# Patient Record
Sex: Female | Born: 1960 | Race: Black or African American | Hispanic: No | Marital: Married | Smoking: Former smoker
Health system: Southern US, Community
[De-identification: ages and names within clinical notes are randomized; demographics above are authoritative.]

## PROBLEM LIST (undated history)

## (undated) DIAGNOSIS — I10 Essential (primary) hypertension: Secondary | ICD-10-CM

## (undated) DIAGNOSIS — I509 Heart failure, unspecified: Secondary | ICD-10-CM

## (undated) DIAGNOSIS — E785 Hyperlipidemia, unspecified: Secondary | ICD-10-CM

## (undated) DIAGNOSIS — E119 Type 2 diabetes mellitus without complications: Secondary | ICD-10-CM

## (undated) DIAGNOSIS — I2510 Atherosclerotic heart disease of native coronary artery without angina pectoris: Secondary | ICD-10-CM

## (undated) DIAGNOSIS — I517 Cardiomegaly: Secondary | ICD-10-CM

## (undated) DIAGNOSIS — I639 Cerebral infarction, unspecified: Secondary | ICD-10-CM

## (undated) DIAGNOSIS — E669 Obesity, unspecified: Secondary | ICD-10-CM

## (undated) DIAGNOSIS — R0600 Dyspnea, unspecified: Secondary | ICD-10-CM

## (undated) DIAGNOSIS — Z8673 Personal history of transient ischemic attack (TIA), and cerebral infarction without residual deficits: Secondary | ICD-10-CM

## (undated) DIAGNOSIS — K219 Gastro-esophageal reflux disease without esophagitis: Secondary | ICD-10-CM

## (undated) DIAGNOSIS — G4733 Obstructive sleep apnea (adult) (pediatric): Secondary | ICD-10-CM

## (undated) HISTORY — DX: Hyperlipidemia, unspecified: E78.5

## (undated) HISTORY — DX: Obstructive sleep apnea (adult) (pediatric): G47.33

## (undated) HISTORY — DX: Obesity, unspecified: E66.9

## (undated) HISTORY — DX: Personal history of transient ischemic attack (TIA), and cerebral infarction without residual deficits: Z86.73

## (undated) HISTORY — DX: Dyspnea, unspecified: R06.00

## (undated) HISTORY — PX: CERVICAL ABLATION: SHX5771

## (undated) HISTORY — DX: Cerebral infarction, unspecified: I63.9

## (undated) HISTORY — DX: Type 2 diabetes mellitus without complications: E11.9

## (undated) HISTORY — DX: Essential (primary) hypertension: I10

## (undated) HISTORY — DX: Gastro-esophageal reflux disease without esophagitis: K21.9

---

## 2000-03-12 DIAGNOSIS — I639 Cerebral infarction, unspecified: Secondary | ICD-10-CM

## 2000-03-12 HISTORY — DX: Cerebral infarction, unspecified: I63.9

## 2004-03-31 ENCOUNTER — Emergency Department: Payer: Self-pay | Admitting: Emergency Medicine

## 2004-04-26 ENCOUNTER — Ambulatory Visit: Payer: Self-pay

## 2005-10-30 ENCOUNTER — Ambulatory Visit: Payer: Self-pay | Admitting: Family Medicine

## 2007-08-04 ENCOUNTER — Emergency Department: Payer: Self-pay | Admitting: Emergency Medicine

## 2007-11-19 ENCOUNTER — Emergency Department: Payer: Self-pay | Admitting: Emergency Medicine

## 2008-01-20 ENCOUNTER — Ambulatory Visit: Payer: Self-pay

## 2008-03-12 HISTORY — PX: CARDIAC CATHETERIZATION: SHX172

## 2008-04-24 ENCOUNTER — Emergency Department: Payer: Self-pay | Admitting: Emergency Medicine

## 2008-08-08 ENCOUNTER — Ambulatory Visit: Payer: Self-pay | Admitting: Cardiology

## 2008-08-08 ENCOUNTER — Inpatient Hospital Stay: Payer: Self-pay | Admitting: *Deleted

## 2008-08-09 ENCOUNTER — Encounter: Payer: Self-pay | Admitting: Cardiology

## 2008-09-07 ENCOUNTER — Emergency Department: Payer: Self-pay | Admitting: Emergency Medicine

## 2008-09-11 DIAGNOSIS — E785 Hyperlipidemia, unspecified: Secondary | ICD-10-CM | POA: Insufficient documentation

## 2008-09-11 DIAGNOSIS — I10 Essential (primary) hypertension: Secondary | ICD-10-CM

## 2008-09-11 DIAGNOSIS — R079 Chest pain, unspecified: Secondary | ICD-10-CM

## 2008-09-15 ENCOUNTER — Ambulatory Visit: Payer: Self-pay | Admitting: Cardiology

## 2008-09-15 ENCOUNTER — Encounter: Payer: Self-pay | Admitting: Cardiology

## 2008-09-15 DIAGNOSIS — R0602 Shortness of breath: Secondary | ICD-10-CM

## 2008-09-17 LAB — CONVERTED CEMR LAB
Pro B Natriuretic peptide (BNP): <2 pg/mL (ref 0.0–100.0)
TSH: 2.136 microintl units/mL (ref 0.350–4.500)

## 2008-09-21 ENCOUNTER — Emergency Department: Payer: Self-pay | Admitting: Unknown Physician Specialty

## 2008-09-22 ENCOUNTER — Ambulatory Visit: Payer: Self-pay | Admitting: Cardiology

## 2008-09-22 ENCOUNTER — Encounter: Payer: Self-pay | Admitting: Cardiology

## 2008-09-29 ENCOUNTER — Ambulatory Visit: Payer: Self-pay

## 2008-09-29 ENCOUNTER — Encounter: Payer: Self-pay | Admitting: Cardiology

## 2008-09-30 LAB — CONVERTED CEMR LAB
Cholesterol: 109 mg/dL
Triglyceride fasting, serum: 40 mg/dL

## 2008-10-07 ENCOUNTER — Ambulatory Visit: Payer: Self-pay | Admitting: Cardiology

## 2008-10-11 ENCOUNTER — Encounter: Payer: Self-pay | Admitting: Cardiology

## 2008-10-12 ENCOUNTER — Encounter: Payer: Self-pay | Admitting: Cardiology

## 2008-10-12 ENCOUNTER — Ambulatory Visit: Payer: Self-pay | Admitting: Cardiology

## 2008-10-18 ENCOUNTER — Encounter: Payer: Self-pay | Admitting: Cardiology

## 2008-12-15 ENCOUNTER — Emergency Department: Payer: Self-pay | Admitting: Emergency Medicine

## 2009-07-10 ENCOUNTER — Emergency Department: Payer: Self-pay | Admitting: Internal Medicine

## 2009-11-18 ENCOUNTER — Emergency Department: Payer: Self-pay | Admitting: Emergency Medicine

## 2009-12-22 ENCOUNTER — Ambulatory Visit: Payer: Self-pay

## 2010-08-25 ENCOUNTER — Encounter: Payer: Self-pay | Admitting: Cardiovascular Disease

## 2011-07-10 LAB — COMPREHENSIVE METABOLIC PANEL
Albumin: 3.4 g/dL (ref 3.4–5.0)
Alkaline Phosphatase: 67 U/L (ref 50–136)
Anion Gap: 6 — ABNORMAL LOW (ref 7–16)
BUN: 15 mg/dL (ref 7–18)
Calcium, Total: 8.9 mg/dL (ref 8.5–10.1)
Chloride: 103 mmol/L (ref 98–107)
Creatinine: 1.06 mg/dL (ref 0.60–1.30)
EGFR (Non-African Amer.): >60
Glucose: 96 mg/dL (ref 65–99)
SGOT(AST): 12 U/L — ABNORMAL LOW (ref 15–37)
Sodium: 137 mmol/L (ref 136–145)

## 2011-07-10 LAB — CBC
HCT: 40.2 % (ref 35.0–47.0)
HGB: 13.2 g/dL (ref 12.0–16.0)
MCH: 30.6 pg (ref 26.0–34.0)
MCV: 93 fL (ref 80–100)
RBC: 4.32 10*6/uL (ref 3.80–5.20)
RDW: 13.3 % (ref 11.5–14.5)
WBC: 10.3 10*3/uL (ref 3.6–11.0)

## 2011-07-10 LAB — CK TOTAL AND CKMB (NOT AT ARMC)
CK, Total: 101 U/L (ref 21–215)
CK-MB: 1.6 ng/mL (ref 0.5–3.6)

## 2011-07-10 LAB — APTT: Activated PTT: 30.4 secs (ref 23.6–35.9)

## 2011-07-10 LAB — PROTIME-INR: Prothrombin Time: 13.3 secs (ref 11.5–14.7)

## 2011-07-11 ENCOUNTER — Observation Stay: Payer: Self-pay | Admitting: *Deleted

## 2011-07-11 DIAGNOSIS — R079 Chest pain, unspecified: Secondary | ICD-10-CM

## 2011-07-11 LAB — CK TOTAL AND CKMB (NOT AT ARMC)
CK, Total: 157 U/L (ref 21–215)
CK-MB: 1.3 ng/mL (ref 0.5–3.6)

## 2011-07-11 LAB — CK-MB: CK-MB: 1.3 ng/mL (ref 0.5–3.6)

## 2011-07-11 LAB — TROPONIN I: Troponin-I: <0.02 ng/mL

## 2011-07-12 LAB — LIPID PANEL
HDL Cholesterol: 43 mg/dL (ref 40–60)
Ldl Cholesterol, Calc: 130 mg/dL — ABNORMAL HIGH (ref 0–100)

## 2011-07-12 LAB — BASIC METABOLIC PANEL
Anion Gap: 10 (ref 7–16)
Chloride: 104 mmol/L (ref 98–107)
Creatinine: 0.94 mg/dL (ref 0.60–1.30)
EGFR (Non-African Amer.): >60
Glucose: 105 mg/dL — ABNORMAL HIGH (ref 65–99)
Osmolality: 278 (ref 275–301)
Sodium: 137 mmol/L (ref 136–145)

## 2011-07-13 ENCOUNTER — Emergency Department: Payer: Self-pay | Admitting: *Deleted

## 2011-07-13 LAB — BASIC METABOLIC PANEL
Anion Gap: 6 — ABNORMAL LOW (ref 7–16)
BUN: 20 mg/dL — ABNORMAL HIGH (ref 7–18)
Chloride: 105 mmol/L (ref 98–107)
Glucose: 108 mg/dL — ABNORMAL HIGH (ref 65–99)
Osmolality: 275 (ref 275–301)
Potassium: 5 mmol/L (ref 3.5–5.1)
Sodium: 136 mmol/L (ref 136–145)

## 2011-07-13 LAB — CBC
HGB: 13.3 g/dL (ref 12.0–16.0)
MCH: 30.3 pg (ref 26.0–34.0)
MCHC: 31.9 g/dL — ABNORMAL LOW (ref 32.0–36.0)
MCV: 95 fL (ref 80–100)
Platelet: 269 10*3/uL (ref 150–440)
RBC: 4.39 10*6/uL (ref 3.80–5.20)
RDW: 14 % (ref 11.5–14.5)
WBC: 15.1 10*3/uL — ABNORMAL HIGH (ref 3.6–11.0)

## 2011-07-13 LAB — TROPONIN I: Troponin-I: <0.02 ng/mL

## 2011-07-25 ENCOUNTER — Encounter: Payer: Self-pay | Admitting: Cardiovascular Disease

## 2011-07-26 ENCOUNTER — Emergency Department: Payer: Self-pay | Admitting: Unknown Physician Specialty

## 2011-07-26 LAB — CBC
HCT: 39.8 % (ref 35.0–47.0)
HGB: 13.4 g/dL (ref 12.0–16.0)
MCH: 31.5 pg (ref 26.0–34.0)
RBC: 4.27 10*6/uL (ref 3.80–5.20)
RDW: 13.2 % (ref 11.5–14.5)
WBC: 9.3 10*3/uL (ref 3.6–11.0)

## 2011-07-26 LAB — PROTIME-INR
INR: 1
Prothrombin Time: 13.4 s

## 2011-07-26 LAB — BASIC METABOLIC PANEL
Calcium, Total: 9.1 mg/dL (ref 8.5–10.1)
Chloride: 104 mmol/L (ref 98–107)
Co2: 28 mmol/L (ref 21–32)
EGFR (Non-African Amer.): 54 — ABNORMAL LOW
Glucose: 93 mg/dL (ref 65–99)
Osmolality: 275 (ref 275–301)
Potassium: 4 mmol/L (ref 3.5–5.1)

## 2011-07-26 LAB — CK TOTAL AND CKMB (NOT AT ARMC)
CK, Total: 58 U/L (ref 21–215)
CK-MB: 0.6 ng/mL (ref 0.5–3.6)

## 2011-07-26 LAB — TROPONIN I: Troponin-I: <0.02 ng/mL

## 2011-07-26 LAB — APTT: Activated PTT: 33.1 s

## 2011-07-26 LAB — MAGNESIUM: Magnesium: 1.6 mg/dL — ABNORMAL LOW

## 2011-07-27 ENCOUNTER — Encounter: Payer: Self-pay | Admitting: Cardiovascular Disease

## 2011-07-27 ENCOUNTER — Ambulatory Visit (INDEPENDENT_AMBULATORY_CARE_PROVIDER_SITE_OTHER): Payer: Self-pay | Admitting: Cardiovascular Disease

## 2011-07-27 VITALS — BP 112/68 | HR 63 | Ht 68.0 in | Wt 272.0 lb

## 2011-07-27 DIAGNOSIS — R0602 Shortness of breath: Secondary | ICD-10-CM

## 2011-07-27 DIAGNOSIS — I10 Essential (primary) hypertension: Secondary | ICD-10-CM

## 2011-07-27 DIAGNOSIS — E785 Hyperlipidemia, unspecified: Secondary | ICD-10-CM

## 2011-07-27 DIAGNOSIS — R079 Chest pain, unspecified: Secondary | ICD-10-CM

## 2011-07-27 NOTE — Assessment & Plan Note (Signed)
Chest pain is likely noncardiac. Symptoms are very atypical, reproducible with palpation. She is out of her Vicodin pills and we have suggested she call her primary care physician. No further cardiac workup needed at this time given normal stress test, recent cardiac catheterization showing no significant disease.

## 2011-07-27 NOTE — Assessment & Plan Note (Signed)
Her cholesterol medication was recently increased from 10 mg to 20 mg daily given she is not at goal, LDL less than 100.

## 2011-07-27 NOTE — Progress Notes (Signed)
Patient ID: Alicia Fischer, female    DOB: June 12, 1960, 51 y.o.   MRN: [id number]  HPI Comments: [name] is a 51 year old woman with morbid obesity, diabetes, CVA in 2002, hypertension, hyperlipidemia who presented to [facility] May 1 after developing chest pain while mopping. Previous cardiac catheterization in 2000 and showed 20-30% nonobstructive disease, ejection fraction 55%. She had a stress test on her recent admission that showed no ischemia. Remote history of smoking stopped in 2000.  She reports that she continues to have rare episodes of chest pain. She was given Vicodin and prednisone by the hospitalist and reports that the prednisone caused leg swelling. She has run out of Vicodin and is now scheduled to be seen at [facility] until early June. She reports having tried proton pump inhibitors, NSAIDs including ibuprofen for pain with no relief. She has also tried hot compresses with no relief. She does not feel it is her heart given the testing that has looked normal. She is able to reproduce her pain by palpation.  Blood work in the hospital shows total cholesterol 182, LDL 130, HDL 43, creatinine 0.94, hemoglobin A1c 6.2  EKG hospital showed sinus bradycardia with rate 55 beats per minute with no significant ST or T wave changes   Outpatient Encounter Prescriptions as of 07/27/2011  Medication Sig Dispense Refill  . aspirin 81 MG tablet Take 81 mg by mouth daily.        Marland Kitchen atorvastatin (LIPITOR) 20 MG tablet Take 20 mg by mouth daily.       . cephALEXin (KEFLEX) 500 MG capsule Take 500 mg by mouth 3 (three) times daily.      Marland Kitchen glipiZIDE (GLUCOTROL) 5 MG tablet Take 5 mg by mouth daily. Takes 1/2 tablet of 5 mg po daily      . Hydrocodone-Guaifenesin (NARCOF PO) Take 5-325 mg by mouth every 6 (six) hours as needed.      Marland Kitchen lisinopril-hydrochlorothiazide (PRINZIDE,ZESTORETIC) 20-12.5 MG per tablet Take 1 tablet by mouth daily.        Marland Kitchen omeprazole (PRILOSEC) 20 MG capsule Take 20 mg by mouth  daily.          Review of Systems  Constitutional: Negative.   HENT: Negative.   Eyes: Negative.   Respiratory: Negative.   Cardiovascular: Positive for chest pain.  Gastrointestinal: Negative.   Musculoskeletal: Negative.   Skin: Negative.   Neurological: Negative.   Hematological: Negative.   Psychiatric/Behavioral: Negative.   All other systems reviewed and are negative.    BP 112/68  Pulse 63  Ht 5\' 8"  (1.727 m)  Wt 272 lb (123.378 kg)  BMI 41.36 kg/m2  Physical Exam  Nursing note and vitals reviewed. Constitutional: She is oriented to person, place, and time. She appears well-developed and well-nourished.  HENT:  Head: Normocephalic.  Nose: Nose normal.  Mouth/Throat: Oropharynx is clear and moist.  Eyes: Conjunctivae are normal. Pupils are equal, round, and reactive to light.  Neck: Normal range of motion. Neck supple. No JVD present.  Cardiovascular: Normal rate, regular rhythm, S1 normal, S2 normal, normal heart sounds and intact distal pulses.  Exam reveals no gallop and no friction rub.   No murmur heard. Pulmonary/Chest: Effort normal and breath sounds normal. No respiratory distress. She has no wheezes. She has no rales. She exhibits no tenderness.  Abdominal: Soft. Bowel sounds are normal. She exhibits no distension. There is no tenderness.  Musculoskeletal: Normal range of motion. She exhibits no edema and no tenderness.  Lymphadenopathy:  She has no cervical adenopathy.  Neurological: She is alert and oriented to person, place, and time. Coordination normal.  Skin: Skin is warm and dry. No rash noted. No erythema.  Psychiatric: She has a normal mood and affect. Her behavior is normal. Judgment and thought content normal.         Assessment and Plan

## 2011-07-27 NOTE — Assessment & Plan Note (Signed)
Blood pressure is well controlled on today's visit. No changes made to the medications. 

## 2011-07-27 NOTE — Patient Instructions (Signed)
You are doing well. No medication changes were made.  Please call us if you have new issues that need to be addressed before your next appt.  Your physician wants you to follow-up in: 12 months.  You will receive a reminder letter in the mail two months in advance. If you don't receive a letter, please call our office to schedule the follow-up appointment. 

## 2012-01-22 ENCOUNTER — Ambulatory Visit: Payer: Self-pay | Admitting: Nurse Practitioner

## 2012-03-12 HISTORY — PX: BREAST BIOPSY: SHX20

## 2012-07-28 ENCOUNTER — Ambulatory Visit: Payer: Self-pay

## 2012-11-09 ENCOUNTER — Ambulatory Visit: Payer: Self-pay | Admitting: Internal Medicine

## 2013-01-06 ENCOUNTER — Emergency Department: Payer: Self-pay | Admitting: Emergency Medicine

## 2013-01-28 ENCOUNTER — Ambulatory Visit: Payer: Self-pay | Admitting: Podiatry

## 2013-01-28 ENCOUNTER — Ambulatory Visit: Payer: Self-pay | Admitting: Family Medicine

## 2013-02-12 ENCOUNTER — Ambulatory Visit: Payer: Self-pay | Admitting: Internal Medicine

## 2013-02-13 ENCOUNTER — Ambulatory Visit: Payer: Self-pay | Admitting: Nurse Practitioner

## 2013-02-16 ENCOUNTER — Ambulatory Visit: Payer: Self-pay | Admitting: Podiatry

## 2013-02-23 ENCOUNTER — Ambulatory Visit: Payer: Self-pay | Admitting: Nurse Practitioner

## 2013-03-02 ENCOUNTER — Ambulatory Visit: Payer: Self-pay | Admitting: Nurse Practitioner

## 2013-03-03 LAB — PATHOLOGY REPORT

## 2013-05-20 ENCOUNTER — Ambulatory Visit: Payer: Self-pay | Admitting: Physician Assistant

## 2013-05-20 LAB — URIC ACID: Uric Acid: 9.3 mg/dL — ABNORMAL HIGH (ref 2.6–6.0)

## 2013-09-04 ENCOUNTER — Emergency Department: Payer: Self-pay | Admitting: Emergency Medicine

## 2013-09-04 LAB — COMPREHENSIVE METABOLIC PANEL
ALBUMIN: 3.5 g/dL (ref 3.4–5.0)
ALT: 15 U/L (ref 12–78)
ANION GAP: 5 — AB (ref 7–16)
Alkaline Phosphatase: 68 U/L
BILIRUBIN TOTAL: 0.3 mg/dL (ref 0.2–1.0)
BUN: 13 mg/dL (ref 7–18)
CHLORIDE: 105 mmol/L (ref 98–107)
Calcium, Total: 9.6 mg/dL (ref 8.5–10.1)
Co2: 29 mmol/L (ref 21–32)
Creatinine: 1.16 mg/dL (ref 0.60–1.30)
EGFR (African American): >60
EGFR (Non-African Amer.): 53 — ABNORMAL LOW
Glucose: 85 mg/dL (ref 65–99)
Osmolality: 277 (ref 275–301)
Potassium: 3.8 mmol/L (ref 3.5–5.1)
SGOT(AST): 21 U/L (ref 15–37)
SODIUM: 139 mmol/L (ref 136–145)
Total Protein: 8 g/dL (ref 6.4–8.2)

## 2013-09-04 LAB — URINALYSIS, COMPLETE
Bacteria: NONE SEEN
Bilirubin,UR: NEGATIVE
Blood: NEGATIVE
Glucose,UR: NEGATIVE mg/dL (ref 0–75)
Ketone: NEGATIVE
Leukocyte Esterase: NEGATIVE
NITRITE: NEGATIVE
Ph: 6 (ref 4.5–8.0)
Protein: NEGATIVE
RBC,UR: NONE SEEN /HPF (ref 0–5)
SPECIFIC GRAVITY: 1.005 (ref 1.003–1.030)
Squamous Epithelial: 1
WBC UR: NONE SEEN /HPF (ref 0–5)

## 2013-09-04 LAB — CBC
HCT: 38.9 % (ref 35.0–47.0)
HGB: 12.9 g/dL (ref 12.0–16.0)
MCH: 29.4 pg (ref 26.0–34.0)
MCHC: 33.2 g/dL (ref 32.0–36.0)
MCV: 88 fL (ref 80–100)
Platelet: 300 10*3/uL (ref 150–440)
RBC: 4.4 10*6/uL (ref 3.80–5.20)
RDW: 14.7 % — AB (ref 11.5–14.5)
WBC: 8.8 10*3/uL (ref 3.6–11.0)

## 2013-09-04 LAB — PRO B NATRIURETIC PEPTIDE: B-TYPE NATIURETIC PEPTID: 151 pg/mL — AB (ref 0–125)

## 2013-09-04 LAB — TROPONIN I: Troponin-I: <0.02 ng/mL

## 2013-09-14 DIAGNOSIS — R945 Abnormal results of liver function studies: Secondary | ICD-10-CM | POA: Insufficient documentation

## 2013-09-14 DIAGNOSIS — M159 Polyosteoarthritis, unspecified: Secondary | ICD-10-CM | POA: Insufficient documentation

## 2013-09-14 DIAGNOSIS — R609 Edema, unspecified: Secondary | ICD-10-CM | POA: Insufficient documentation

## 2013-09-14 DIAGNOSIS — G2581 Restless legs syndrome: Secondary | ICD-10-CM | POA: Insufficient documentation

## 2013-09-14 DIAGNOSIS — N289 Disorder of kidney and ureter, unspecified: Secondary | ICD-10-CM | POA: Insufficient documentation

## 2013-09-14 DIAGNOSIS — G4733 Obstructive sleep apnea (adult) (pediatric): Secondary | ICD-10-CM | POA: Insufficient documentation

## 2013-09-14 DIAGNOSIS — R252 Cramp and spasm: Secondary | ICD-10-CM | POA: Insufficient documentation

## 2013-09-14 DIAGNOSIS — M25579 Pain in unspecified ankle and joints of unspecified foot: Secondary | ICD-10-CM | POA: Insufficient documentation

## 2013-09-14 DIAGNOSIS — R928 Other abnormal and inconclusive findings on diagnostic imaging of breast: Secondary | ICD-10-CM | POA: Insufficient documentation

## 2013-09-14 DIAGNOSIS — E119 Type 2 diabetes mellitus without complications: Secondary | ICD-10-CM | POA: Insufficient documentation

## 2013-09-14 DIAGNOSIS — M21619 Bunion of unspecified foot: Secondary | ICD-10-CM | POA: Insufficient documentation

## 2013-09-14 DIAGNOSIS — M109 Gout, unspecified: Secondary | ICD-10-CM | POA: Insufficient documentation

## 2014-07-04 NOTE — H&P (Signed)
PATIENT NAME:  Alicia Fischer, Alicia Fischer MR#:  [id number] DATE OF BIRTH:  April 21, 1960  DATE OF ADMISSION:  07/11/2011  PRIMARY CARE PHYSICIAN: Bernestine Amass  PRIMARY CARDIOLOGIST: [facility]   SUBJECTIVE: 54 year old African American female with history of type 2 diabetes, history of cerebrovascular accident with no residual deficit in 2001, hypertension, dyslipidemia who comes in with sudden onset of chest pain. The patient started having substernal chest pain without any radiation while she was sweeping the floor today. The patient's pain lasted for 10 to 15 minutes without radiation. The patient states that the pain is somewhat reproducible but not particularly worse with movement. The patient does have some T wave flattening in the anterior leads. She denied any syncope, near syncope or lightheadedness. She denied any orthopnea, paroxysmal nocturnal dyspnea, dependent edema. The patient had a cardiac catheterization done in 2010 that showed nonobstructive coronary artery disease with an ejection fraction of 55%. She also noted some right ankle swelling but no fever, chills, rigors or cough.   PAST MEDICAL HISTORY: 1. Type 2 diabetes on metformin. 2. Hypertension. 3. Dyslipidemia. 4. History of stroke in 2001.  PAST SURGICAL HISTORY: None.  HOME MEDICATIONS: 1. Metformin. 2. Lisinopril. 3. HCTZ. 4. Lipitor. 5. Aspirin.  ALLERGIES: No known drug allergies.   FAMILY HISTORY: Positive for father dying of heart attack in his 60s.  SOCIAL HISTORY: She quit smoking in 2000. No history of alcohol use. She works as a Lawyer.  REVIEW OF SYSTEMS: CONSTITUTIONAL: She denies any fatigue or weakness. EYES: No blurry vision or double vision. No redness or inflammation. ENT: No tinnitus, ear pain, hearing problems, epistaxis. RESPIRATORY: No cough, wheezing, hemoptysis or dyspnea. CARDIOVASCULAR: No orthopnea. She does have complaints of chest pain. No palpitations, near syncopal episode. GASTROINTESTINAL:  No nausea, vomiting, abdominal pain or hematemesis. GENITOURINARY: No dysuria, hematuria, frequency or urgency. ENDOCRINE: No polyuria, nocturia, heat or cold intolerance. HEME: No anemia or easy bruising or bleeding. INTEGUMENT: No acne or rash. MUSCULOSKELETAL: No arthritis, cramps or swelling. NEUROLOGICAL: No numbness, weakness or dysarthria.   PHYSICAL EXAMINATION: VITAL SIGNS: Blood pressure 124/76, pulse 53, respirations 18.  GENERAL: Comfortable, in no acute cardiopulmonary distress.  HEENT: Pupils equal, reactive. Extraocular movements are intact.  NECK: Supple. No jugular venous distention.  LUNGS: Clear to auscultation bilaterally. No wheezes or crackles or rhonchi.  CARDIOVASCULAR: Regular rate and rhythm. No murmurs, rubs or gallops.  ABDOMEN: Soft, nontender, nondistended.   EXTREMITIES: Trace pedal edema around the right ankle without any erythema, induration.   NEUROLOGICAL: Cranial nerves II through XII grossly intact. No dysarthria. No aphasia. No dysphagia.   PSYCH: Appropriate mood and affect. Alert x3.   LABORATORY, DIAGNOSTIC AND RADIOLOGICAL DATA:  EKG shows sinus rhythm with T wave flattening in the anterolateral leads.   Troponin negative. INR 1.0. WBC 10.3, hCG 13.2, hematocrit 40.2, platelet count 251, serum gluteal 96, BUN 15, creatinine 1.06, sodium 157, potassium 4.0, chloride 103, bicarbonate 28, calcium 8.9, total bilirubin 0.2, alkaline phosphatase 67, AST 10, ALT 12, d-dimer 0.29, troponin negative.   Chest x-ray does not show any obvious infiltrate.   ASSESSMENT AND PLAN: 1. Chest pain. Appears to be atypical for anginal pain. The patient does have multiple cardiovascular risk factors including a positive family history, prior history of cerebrovascular accident, nonobstructive coronary artery disease therefore the patient will be scheduled for a LexiScan Myoview. She will be maintained on telemetry. We will cycle cardiac enzymes. Hold beta blocker  because of listed allergy. Continue with aspirin, Lipitor. Hemoglobin  A1c and a lipid profile will be obtained in the morning. He d-dimer is negative. Dr. Mariah MillingGollan from cardiology will also be consulted.  2. Hypertension. Will continue ACE inhibitor. Hold HCTZ for now.  3. Type 2 diabetes. Hold metformin. We will place her on sliding scale insulin.  4. In anticipation of a stress test the patient will be n.p.o. 5. CODE STATUS: She is a FULL CODE.   ____________________________ Alicia OverlieNayana Kyzer Blowe, MD na:cms D: 07/11/2011 04:09:40 ET T: 07/11/2011 07:04:50 ET JOB#: [id number]  cc: Alicia OverlieNayana Callan Yontz, MD, <Dictator> [facility] Alicia OverlieNAYANA Nicolaos Mitrano MD ELECTRONICALLY SIGNED 08/22/2011 0:12

## 2014-07-04 NOTE — Consult Note (Signed)
General Aspect 54 year old [place] American female with history of type 2 diabetes, history of cerebrovascular accident in 2002, hypertension, dyslipidemia who comes in with sudden onset of chest pain while mopping. Cardioogy was consulted for chest pain.  The patient started having substernal chest pain  while she was sweeping the floor. It has continued since then. It is worse wth movement. Some radiation around the left shoulder.  The patient states that the pain is somewhat reproducible. She reports having chest ains on and off.  She denied any syncope, near syncope or lightheadedness. She denied any orthopnea, paroxysmal nocturnal dyspnea, dependent edema. no fever, chills, rigors or cough. Some anle swelling at the end of the day. mother had similar sx of sweling.   The patient had a cardiac catheterization done in 2010 that showed nonobstructive coronary artery disease with an ejection fraction of 55%.   PAST MEDICAL HISTORY: 1. Type 2 diabetes on metformin. 2. Hypertension. 3. Dyslipidemia. 4. History of stroke in 2002.  PAST SURGICAL HISTORY: None.  ALLERGIES: No known drug allergies.   FAMILY HISTORY: Positive for father dying of heart attack in his 34s.  SOCIAL HISTORY: She quit smoking in 2000. No history of alcohol use. She works as a Quarry manager.   Physical Exam:   GEN well developed, well nourished, no acute distress, obese    HEENT red conjunctivae    NECK supple  No masses     RESP normal resp effort  clear BS     CARD Regular rate and rhythm  No murmur     ABD denies tenderness  soft     LYMPH negative neck    EXTR negative edema    SKIN normal to palpation    NEURO motor/sensory function intact    PSYCH alert, A+O to time, place, person, good insight   Review of Systems:   Subjective/Chief Complaint chest pain    General: No Complaints     Skin: No Complaints     ENT: No Complaints     Eyes: No Complaints     Neck: No Complaints     Respiratory:  No Complaints     Cardiovascular: Chest pain or discomfort     Gastrointestinal: No Complaints     Genitourinary: No Complaints     Vascular: No Complaints     Musculoskeletal: No Complaints     Neurologic: No Complaints     Hematologic: No Complaints     Endocrine: No Complaints     Psychiatric: No Complaints     Review of Systems: All other systems were reviewed and found to be negative     Medications/Allergies Reviewed Medications/Allergies reviewed           Admit Diagnosis:   ACUTE CHEST PAIN: 11-Jul-2011, Active, ACUTE CHEST PAIN      Admit Reason:   Acute chest pain: (786.50) 11-Jul-2011, Active, ICD9, Unspecified chest pain  Home Medications:  Medication Instructions Status  Percocet 5/325 oral tablet 1 cap(s) orally every 4 to 6 hours PRN Pain   Active  glipiZIDE 5 mg oral tablet   once a day  Active  lisinopril/hctz 20/12.5 1tab daily  Active  aspirin 81 mg oral tablet, chewable 1   once a day  Active  lipitor  Active     Cardiac:  30-Apr-13 19:22    Troponin I < 0.02   CK, Total 101   CPK-MB, Serum 1.6  Routine Coag:  30-Apr-13 19:22    Prothrombin 13.3  INR 1.0   Activated PTT (APTT) 30.4  Routine Hem:  30-Apr-13 19:22    WBC (CBC) 10.3   RBC (CBC) 4.32   Hemoglobin (CBC) 13.2   Hematocrit (CBC) 40.2   Platelet Count (CBC) 251   MCV 93   MCH 30.6   MCHC 32.9   RDW 13.3  Routine Chem:  30-Apr-13 19:22    Glucose, Serum 96   BUN 15   Creatinine (comp) 1.06   Sodium, Serum 137   Potassium, Serum 4.0   Chloride, Serum 103   CO2, Serum 28   Calcium (Total), Serum 8.9  Hepatic:  30-Apr-13 19:22    Bilirubin, Total 0.2   Alkaline Phosphatase 67   SGPT (ALT) 10   SGOT (AST) 12   Total Protein, Serum 7.9   Albumin, Serum 3.4  Routine Chem:  30-Apr-13 19:22    Osmolality (calc) 275   eGFR (African American) >60   eGFR (Non-African American) >60   Anion Gap 6  Routine Coag:  30-Apr-13 19:22    D-Dimer, Quantitative 0.29   Cardiology:  30-Apr-13 19:22    Ventricular Rate 63   Atrial Rate 63   P-R Interval 164   QRS Duration 86   QT 424   QTc 433   P Axis 44   R Axis 18   T Axis 9  Blood Glucose:  01-May-13 07:41    POCT Blood Glucose 103   EKG:   Interpretation EKG shows NSR with rate 63 bpm, T wave ABN V1 to V4, poor R wave progression through the anerior precordial leads.   Radiology Results: XRay:    30-Apr-13 20:10, Chest 1 View AP or PA   Chest 1 View AP or PA    PRELIMINARY REPORT    The following is a PRELIMINARY Radiology report.  A final report will follow pending radiologist verification.      REASON FOR EXAM:    chest pain  COMMENTS:       PROCEDURE: DXR - DXR CHEST 1 VIEWAP OR PA  - Jul 10 2011  8:10PM     RESULT: Comparison is made to a prior exam 09/21/2008. The lung fields are   clear. No pneumonia, pneumothorax or pleural effusion is seen. Heart size   is normal.    IMPRESSION:     No acute changes are identified.    Thank you for the opportunity to contribute to the care of your patient.       Dictated By: Dionne Ano WALL, M.D., MD    Beta Blockers: Other  Vital Signs/Nurse's Notes: **Vital Signs.:   01-May-13 06:23   Vital Signs Type Admission   Temperature Temperature (F) 97.6   Celsius 36.4   Pulse Pulse 57   Respirations Respirations 18   Systolic BP Systolic BP 94   Diastolic BP (mmHg) Diastolic BP (mmHg) 63   Mean BP 73   Pulse Ox % Pulse Ox % 96     Impression 54 year old [place] American female with history of type 2 diabetes, history of cerebrovascular accident in 2002, hypertension, dyslipidemia who comes in with sudden onset of chest pain while mopping. Cardioogy was consulted for chest pain.  1)  Chest pain cardiac enz negative Stil with chest pains Some atyical features Stress test this AM Rests pending --Hold on b-blocker given bradycardia (also problem in the past) --Would continue outpt medications --If stress test is negative, no  further workup needed. Could try NSAIDS and pain medication if stress test  is negative.  2)  Diabetes: per medical service Wel controlled  3) HTN: BP low: Discussed with patient, she could potential cut the isinopril in 1/2  4) Hyperlipidemia On lipitor 10 mg daily  5) h/o CVA Etiology uncertain, over ten years ago.   Electronic Signatures: Ida Rogue (MD)  (Signed 01-May-13 10:01)  Authored: General Aspect/Present Illness, History and Physical Exam, Review of System, Health Issues, Home Medications, Labs, EKG , Radiology, Allergies, Vital Signs/Nurse's Notes, Impression/Plan   Last Updated: 01-May-13 10:01 by Ida Rogue (MD)

## 2014-07-04 NOTE — Discharge Summary (Signed)
PATIENT NAME:  Alicia Fischer, Alicia Fischer MR#:  [id number] DATE OF BIRTH:  1960/11/06  DATE OF ADMISSION:  07/11/2011 DATE OF DISCHARGE:  07/12/2011  DISCHARGE DIAGNOSES:  1. Atypical chest pain, myocardial infarction ruled out. Myoview no ischemia. Suspect musculoskeletal etiology may be costochondritis. 2. Diabetes. 3. Hypertension. 4. Hyperlipidemia. 5. History of cerebrovascular accident. 6. Obesity.  CONSULT: Cardiology, Dr. Mariah MillingGollan.   HOSPITAL COURSE: A 54 year old female who has history of diabetes, hypertension, hyperlipidemia, previous history of stroke in 2001. She presented with chest pain. The chest pain started while she was sweeping the floor. The pain is somewhat reproducible. She had some chest tenderness also. She had a cardiac catheterization in 2010 which showed nonobstructive coronary artery disease with ejection fraction of 55%. She was admitted as atypical chest pain on telemetry. Her EKG when she came in showed she has sinus rhythm, nonspecific T wave changes. No acute ischemia. She ruled out for myocardial infarction. She remained stable on telemetry. She takes aspirin, ACE inhibitor and statin at home which was continued. She had a Myoview done yesterday and the Myoview showed she had pharmacological myocardial perfusion imaging study with no significant ischemia. Normal ejection fraction of 65%. No focal wall motion abnormalities. No EKG changes concerning for ischemia. Low risk scan. Patient continued to have some musculoskeletal chest pain and chest tenderness so she was started on a prednisone taper and Norco p.r.n. She says non-steroidal anti-inflammatory medications were not helping her. Her chest pain has improved right now. Will discharge her on Norco p.r.n. and on a prednisone taper. Her chest x-ray was negative. She had a d-dimer done that was negative at 0.29. When she came in her white count was normal at 10.3, hemoglobin 13.2, her creatinine was normal at 1.06. LFTs were normal.  Her hemoglobin A1c is 6.2. Her lipid profile shows that she had an LDL of 130 so I am going to increase her Lipitor to 20 mg daily to better control her risk factors. If she continues to have chest pain again she needs to follow up with cardiology.    MEDICATIONS AT DISCHARGE: 1. Glipizide 5 mg daily. 2. Lisinopril/hydrochlorothiazide 20/12.5 daily.  3. Aspirin 81 mg daily.  4. Change Lipitor to 20 mg p.o. once daily.  5. New medication: Prednisone taper 30 mg p.o. once daily for one day then 20 mg p.o. once daily for one day, then 10 mg p.o. once daily for one day then discontinue. 6. Norco 5/325, 1 tablet p.o. q.6 hours as needed for severe pain.   CONDITION AT DISCHARGE: She is comfortable.   PHYSICAL EXAMINATION: T-max 97.4, heart rate 60, blood pressure 111/68, saturating 95% on room air. Chest is clear. Heart sounds are regular. Abdomen soft, nontender. She is ambulating without chest pain. Her blood pressure medications can be adjusted as outpatient. Right now blood pressure is stable on the current regimen of medications that she is taking.   DIET: Advised a low sodium, ADA, low cholesterol diet.   FOLLOW UP: Patient should follow up with PMD at [facility] in one week. Follow up Dr. Mariah MillingGollan, [facility], in 2 to 3 weeks.   TIME SPENT: 40 minutes.   ____________________________ Fredia SorrowAbhinav Adaijah Endres, MD ag:cms D: 07/12/2011 14:06:27 ET T: 07/13/2011 10:12:28 ET JOB#: [id number]  cc: Fredia SorrowAbhinav Aleighya Mcanelly, MD, <Dictator> [facility] Antonieta Ibaimothy J. Gollan, MD Fredia SorrowABHINAV Evaluna Utke MD ELECTRONICALLY SIGNED 07/21/2011 12:33

## 2014-07-04 NOTE — Discharge Summary (Signed)
PATIENT NAME:  Alicia Fischer, Alicia Fischer MR#:  [id number] DATE OF BIRTH:  07-16-1960  DATE OF ADMISSION:  07/11/2011 DATE OF DISCHARGE:  07/12/2011   ADDENDUM:  Since her sugars are running like 69 even on steroids and her hemoglobin A1c is 6.2, I will cut down her glipizide to (half a pill of 5 mg), 2.5 mg p.o. once daily. That has been added to her discharge instructions. If her sugars are still running low, then she can be taken off the glipizide.   ____________________________ Fredia SorrowAbhinav Sherhonda Gaspar, MD ag:drc D: 07/12/2011 14:13:06 ET T: 07/13/2011 10:28:03 ET JOB#: [id number]  cc: Fredia SorrowAbhinav Teneka Malmberg, MD, <Dictator> [facility] Fredia SorrowABHINAV Claritza July MD ELECTRONICALLY SIGNED 07/21/2011 12:33

## 2014-07-26 ENCOUNTER — Encounter: Payer: Self-pay | Admitting: Emergency Medicine

## 2014-07-26 ENCOUNTER — Emergency Department
Admission: EM | Admit: 2014-07-26 | Discharge: 2014-07-26 | Disposition: A | Discharge status: Home / Self Care | Payer: No Typology Code available for payment source | Attending: Emergency Medicine | Admitting: Emergency Medicine

## 2014-07-26 ENCOUNTER — Emergency Department: Payer: No Typology Code available for payment source

## 2014-07-26 DIAGNOSIS — Z87891 Personal history of nicotine dependence: Secondary | ICD-10-CM | POA: Diagnosis not present

## 2014-07-26 DIAGNOSIS — Y998 Other external cause status: Secondary | ICD-10-CM | POA: Diagnosis not present

## 2014-07-26 DIAGNOSIS — Z7982 Long term (current) use of aspirin: Secondary | ICD-10-CM | POA: Insufficient documentation

## 2014-07-26 DIAGNOSIS — Z79899 Other long term (current) drug therapy: Secondary | ICD-10-CM | POA: Diagnosis not present

## 2014-07-26 DIAGNOSIS — I10 Essential (primary) hypertension: Secondary | ICD-10-CM | POA: Insufficient documentation

## 2014-07-26 DIAGNOSIS — E119 Type 2 diabetes mellitus without complications: Secondary | ICD-10-CM | POA: Insufficient documentation

## 2014-07-26 DIAGNOSIS — S60222A Contusion of left hand, initial encounter: Secondary | ICD-10-CM | POA: Diagnosis not present

## 2014-07-26 DIAGNOSIS — Y92410 Unspecified street and highway as the place of occurrence of the external cause: Secondary | ICD-10-CM | POA: Diagnosis not present

## 2014-07-26 DIAGNOSIS — Y9389 Activity, other specified: Secondary | ICD-10-CM | POA: Diagnosis not present

## 2014-07-26 DIAGNOSIS — S6992XA Unspecified injury of left wrist, hand and finger(s), initial encounter: Secondary | ICD-10-CM | POA: Diagnosis present

## 2014-07-26 MED ORDER — IBUPROFEN 800 MG PO TABS: 800.0000 mg | ORAL_TABLET | Freq: Three times a day (TID) | ORAL | Status: DC | PRN

## 2014-07-26 MED ORDER — CYCLOBENZAPRINE HCL 5 MG PO TABS: 5.0000 mg | ORAL_TABLET | Freq: Three times a day (TID) | ORAL | Status: DC | PRN
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
## 2014-07-26 NOTE — ED Notes (Signed)
[place]  Reports mild dizzy feeling and pain in L hand.  No swelling or deformity noted.
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
## 2015-02-04 ENCOUNTER — Emergency Department: Payer: Self-pay

## 2015-02-04 ENCOUNTER — Encounter: Payer: Self-pay | Admitting: Emergency Medicine

## 2015-02-04 ENCOUNTER — Emergency Department
Admission: EM | Admit: 2015-02-04 | Discharge: 2015-02-04 | Disposition: A | Discharge status: Home / Self Care | Payer: Self-pay | Attending: Emergency Medicine | Admitting: Emergency Medicine

## 2015-02-04 DIAGNOSIS — Z79899 Other long term (current) drug therapy: Secondary | ICD-10-CM | POA: Insufficient documentation

## 2015-02-04 DIAGNOSIS — E119 Type 2 diabetes mellitus without complications: Secondary | ICD-10-CM | POA: Insufficient documentation

## 2015-02-04 DIAGNOSIS — I10 Essential (primary) hypertension: Secondary | ICD-10-CM | POA: Insufficient documentation

## 2015-02-04 DIAGNOSIS — Z7982 Long term (current) use of aspirin: Secondary | ICD-10-CM | POA: Insufficient documentation

## 2015-02-04 DIAGNOSIS — Z87891 Personal history of nicotine dependence: Secondary | ICD-10-CM | POA: Insufficient documentation

## 2015-02-04 DIAGNOSIS — R079 Chest pain, unspecified: Secondary | ICD-10-CM | POA: Insufficient documentation

## 2015-02-04 DIAGNOSIS — Z792 Long term (current) use of antibiotics: Secondary | ICD-10-CM | POA: Insufficient documentation

## 2015-02-04 DIAGNOSIS — R05 Cough: Secondary | ICD-10-CM | POA: Insufficient documentation

## 2015-02-04 HISTORY — DX: Heart failure, unspecified: I50.9

## 2015-02-04 LAB — COMPREHENSIVE METABOLIC PANEL
ALT: 13 U/L — AB (ref 14–54)
AST: 15 U/L (ref 15–41)
Albumin: 4 g/dL (ref 3.5–5.0)
Alkaline Phosphatase: 79 U/L (ref 38–126)
Anion gap: 10 (ref 5–15)
BILIRUBIN TOTAL: 0.3 mg/dL (ref 0.3–1.2)
BUN: 19 mg/dL (ref 6–20)
CHLORIDE: 104 mmol/L (ref 101–111)
CO2: 25 mmol/L (ref 22–32)
CREATININE: 1 mg/dL (ref 0.44–1.00)
Calcium: 9.9 mg/dL (ref 8.9–10.3)
GFR calc Af Amer: >60 mL/min (ref >60)
GFR calc non Af Amer: >60 mL/min (ref >60)
Glucose, Bld: 91 mg/dL (ref 65–99)
Potassium: 3.9 mmol/L (ref 3.5–5.1)
Sodium: 139 mmol/L (ref 135–145)
TOTAL PROTEIN: 7.8 g/dL (ref 6.5–8.1)

## 2015-02-04 LAB — CBC
HCT: 40.9 % (ref 35.0–47.0)
Hemoglobin: 13.3 g/dL (ref 12.0–16.0)
MCH: 30.2 pg (ref 26.0–34.0)
MCHC: 32.6 g/dL (ref 32.0–36.0)
MCV: 92.6 fL (ref 80.0–100.0)
Platelets: 298 10*3/uL (ref 150–440)
RBC: 4.41 MIL/uL (ref 3.80–5.20)
RDW: 14.3 % (ref 11.5–14.5)
WBC: 11.1 10*3/uL — AB (ref 3.6–11.0)

## 2015-02-04 LAB — BRAIN NATRIURETIC PEPTIDE: B Natriuretic Peptide: 36 pg/mL (ref 0.0–100.0)

## 2015-02-04 LAB — TROPONIN I: Troponin I: <0.03 ng/mL (ref <0.031)

## 2015-02-04 MED ORDER — GI COCKTAIL ~~LOC~~
30.0000 mL | Freq: Once | ORAL | Status: AC
  Administered 2015-02-04: 30 mL via ORAL
  Filled 2015-02-04: qty 30

## 2015-02-04 NOTE — Discharge Instructions (Signed)
Nonspecific Chest Pain  °Chest pain can be caused by many different conditions. There is always a chance that your pain could be related to something serious, such as a heart attack or a blood clot in your lungs. Chest pain can also be caused by conditions that are not life-threatening. If you have chest pain, it is very important to follow up with your health care provider. °CAUSES  °Chest pain can be caused by: °· Heartburn. °· Pneumonia or bronchitis. °· Anxiety or stress. °· Inflammation around your heart (pericarditis) or lung (pleuritis or pleurisy). °· A blood clot in your lung. °· A collapsed lung (pneumothorax). It can develop suddenly on its own (spontaneous pneumothorax) or from trauma to the chest. °· Shingles infection (varicella-zoster virus). °· Heart attack. °· Damage to the bones, muscles, and cartilage that make up your chest wall. This can include: °¨ Bruised bones due to injury. °¨ Strained muscles or cartilage due to frequent or repeated coughing or overwork. °¨ Fracture to one or more ribs. °¨ Sore cartilage due to inflammation (costochondritis). °RISK FACTORS  °Risk factors for chest pain may include: °· Activities that increase your risk for trauma or injury to your chest. °· Respiratory infections or conditions that cause frequent coughing. °· Medical conditions or overeating that can cause heartburn. °· Heart disease or family history of heart disease. °· Conditions or health behaviors that increase your risk of developing a blood clot. °· Having had chicken pox (varicella zoster). °SIGNS AND SYMPTOMS °Chest pain can feel like: °· Burning or tingling on the surface of your chest or deep in your chest. °· Crushing, pressure, aching, or squeezing pain. °· Dull or sharp pain that is worse when you move, cough, or take a deep breath. °· Pain that is also felt in your back, neck, shoulder, or arm, or pain that spreads to any of these areas. °Your chest pain may come and go, or it may stay  constant. °DIAGNOSIS °Lab tests or other studies may be needed to find the cause of your pain. Your health care provider may have you take a test called an ambulatory ECG (electrocardiogram). An ECG records your heartbeat patterns at the time the test is performed. You may also have other tests, such as: °· Transthoracic echocardiogram (TTE). During echocardiography, sound waves are used to create a picture of all of the heart structures and to look at how blood flows through your heart. °· Transesophageal echocardiogram (TEE). This is a more advanced imaging test that obtains images from inside your body. It allows your health care provider to see your heart in finer detail. °· Cardiac monitoring. This allows your health care provider to monitor your heart rate and rhythm in real time. °· Holter monitor. This is a portable device that records your heartbeat and can help to diagnose abnormal heartbeats. It allows your health care provider to track your heart activity for several days, if needed. °· Stress tests. These can be done through exercise or by taking medicine that makes your heart beat more quickly. °· Blood tests. °· Imaging tests. °TREATMENT  °Your treatment depends on what is causing your chest pain. Treatment may include: °· Medicines. These may include: °¨ Acid blockers for heartburn. °¨ Anti-inflammatory medicine. °¨ Pain medicine for inflammatory conditions. °¨ Antibiotic medicine, if an infection is present. °¨ Medicines to dissolve blood clots. °¨ Medicines to treat coronary artery disease. °· Supportive care for conditions that do not require medicines. This may include: °¨ Resting. °¨ Applying heat   or cold packs to injured areas. °¨ Limiting activities until pain decreases. °HOME CARE INSTRUCTIONS °· If you were prescribed an antibiotic medicine, finish it all even if you start to feel better. °· Avoid any activities that bring on chest pain. °· Do not use any tobacco products, including  cigarettes, chewing tobacco, or electronic cigarettes. If you need help quitting, ask your health care provider. °· Do not drink alcohol. °· Take medicines only as directed by your health care provider. °· Keep all follow-up visits as directed by your health care provider. This is important. This includes any further testing if your chest pain does not go away. °· If heartburn is the cause for your chest pain, you may be told to keep your head raised (elevated) while sleeping. This reduces the chance that acid will go from your stomach into your esophagus. °· Make lifestyle changes as directed by your health care provider. These may include: °¨ Getting regular exercise. Ask your health care provider to suggest some activities that are safe for you. °¨ Eating a heart-healthy diet. A registered dietitian can help you to learn healthy eating options. °¨ Maintaining a healthy weight. °¨ Managing diabetes, if necessary. °¨ Reducing stress. °SEEK MEDICAL CARE IF: °· Your chest pain does not go away after treatment. °· You have a rash with blisters on your chest. °· You have a fever. °SEEK IMMEDIATE MEDICAL CARE IF:  °· Your chest pain is worse. °· You have an increasing cough, or you cough up blood. °· You have severe abdominal pain. °· You have severe weakness. °· You faint. °· You have chills. °· You have sudden, unexplained chest discomfort. °· You have sudden, unexplained discomfort in your arms, back, neck, or jaw. °· You have shortness of breath at any time. °· You suddenly start to sweat, or your skin gets clammy. °· You feel nauseous or you vomit. °· You suddenly feel light-headed or dizzy. °· Your heart begins to beat quickly, or it feels like it is skipping beats. °These symptoms may represent a serious problem that is an emergency. Do not wait to see if the symptoms will go away. Get medical help right away. Call your local emergency services (911 in the [place] Do not drive yourself to the hospital. °  °This  information is not intended to replace advice given to you by your health care provider. Make sure you discuss any questions you have with your health care provider. °  °Document Released: 12/06/2004 Document Revised: 03/19/2014 Document Reviewed: 10/02/2013 °Elsevier Interactive Patient Education ©2016 [employer]. ° °

## 2015-02-04 NOTE — ED Notes (Signed)
Pt reports shortness of breath x1 week, reports pain when taking a deep breath and pain in back area. Pt reports she has been taking sinus drainage medication and thinks "it drained down into my chest".

## 2015-02-04 NOTE — ED Provider Notes (Signed)
[facility] Emergency Department Provider Note  ____________________________________________  Time seen: On arrival  I have reviewed the triage vital signs and the nursing notes.   HISTORY  Chief Complaint Shortness of Breath  and chest tightness   HPI Alicia Fischer is a 54 y.o. female with history of diabetes and high blood pressure who presents with complaints of chest tightness. She believes this is related to an upper respiratory infection that has moved to her chest. She reports discomfort with taking a deep breath. She denies pressure-like chest pain. No recent travel. No calf pain. She denies radiation of chest discomfort to me. No fevers chills.     Past Medical History  Diagnosis Date  . Type 2 diabetes mellitus (HCC)     on metformin  . Hypertension     on Lisinopril/ HCTZ  . Hyperlipidemia     on Lipitor  . Stroke [facility]) 2002    without any residual deficit  . Obesity   . GERD (gastroesophageal reflux disease)   . Chest pain 6/10    probable costochondritits  . OSA (obstructive sleep apnea)   . Dyspnea     Chronic, Echo 7/10: EF 55-60%, no valvular problems  . Dyslipidemia   . History of stroke   . Diabetes mellitus   . CHF (congestive heart failure) [facility])     Patient Active Problem List   Diagnosis Date Noted  . SHORTNESS OF BREATH 09/15/2008  . HYPERLIPIDEMIA-MIXED 09/11/2008  . HYPERTENSION, BENIGN 09/11/2008  . CHEST PAIN-UNSPECIFIED 09/11/2008    Past Surgical History  Procedure Laterality Date  . Cardiac catheterization  2010    Current Outpatient Rx  Name  Route  Sig  Dispense  Refill  . aspirin 81 MG tablet   Oral   Take 81 mg by mouth daily.           Marland Kitchen atorvastatin (LIPITOR) 20 MG tablet   Oral   Take 20 mg by mouth daily.          . cephALEXin (KEFLEX) 500 MG capsule   Oral   Take 500 mg by mouth 3 (three) times daily.         . cyclobenzaprine (FLEXERIL) 5 MG tablet   Oral   Take 1 tablet (5  mg total) by mouth every 8 (eight) hours as needed for muscle spasms.   30 tablet   0   . glipiZIDE (GLUCOTROL) 5 MG tablet   Oral   Take 5 mg by mouth daily. Takes 1/2 tablet of 5 mg po daily         . Hydrocodone-Guaifenesin (NARCOF PO)   Oral   Take 5-325 mg by mouth every 6 (six) hours as needed.         Marland Kitchen ibuprofen (ADVIL,MOTRIN) 800 MG tablet   Oral   Take 1 tablet (800 mg total) by mouth every 8 (eight) hours as needed.   30 tablet   0   . lisinopril-hydrochlorothiazide (PRINZIDE,ZESTORETIC) 20-12.5 MG per tablet   Oral   Take 1 tablet by mouth daily.           Marland Kitchen omeprazole (PRILOSEC) 20 MG capsule   Oral   Take 20 mg by mouth daily.             Allergies Beta adrenergic blockers and Codeine  Family History  Problem Relation Age of Onset  . Heart attack Father 81    Social History Social History  Substance Use Topics  .  Smoking status: Former Games developer  . Smokeless tobacco: Former Neurosurgeon    Quit date: 03/12/1998  . Alcohol Use: No    Review of Systems  Constitutional: Negative for fever. Eyes: Negative for visual changes. ENT: Negative for sore throat Cardiovascular: Positive for chest tightness Respiratory: Positive for cough Gastrointestinal: Negative for abdominal pain, vomiting and diarrhea. Genitourinary: Negative for dysuria. Musculoskeletal: Negative for back pain. Skin: Negative for rash. Neurological: Negative for headaches or focal weakness Psychiatric: No anxiety    ____________________________________________   PHYSICAL EXAM:  VITAL SIGNS: ED Triage Vitals  Enc Vitals Group     BP 02/04/15 1640 153/77 mmHg     Pulse Rate 02/04/15 1640 75     Resp 02/04/15 1640 16     Temp 02/04/15 1640 97.7 F (36.5 C)     Temp Source 02/04/15 1640 Oral     SpO2 02/04/15 1640 98 %     Weight 02/04/15 1640 250 lb (113.399 kg)     Height 02/04/15 1640  (1.702 m)     Head Cir --      Peak Flow --      Pain Score 02/04/15 1640 8      Pain Loc --      Pain Edu? --      Excl. in GC? --      Constitutional: Alert and oriented. Well appearing and in no distress. Eyes: Conjunctivae are normal.  ENT   Head: Normocephalic and atraumatic.   Mouth/Throat: Mucous membranes are moist. Cardiovascular: Normal rate, regular rhythm. Normal and symmetric distal pulses are present in all extremities. No murmurs, rubs, or gallops. Respiratory: Normal respiratory effort without tachypnea nor retractions. Breath sounds are clear and equal bilaterally.  Gastrointestinal: Soft and non-tender in all quadrants. No distention. There is no CVA tenderness. Genitourinary: deferred Musculoskeletal: Nontender with normal range of motion in all extremities. No lower extremity tenderness nor edema. Neurologic:  Normal speech and language. No gross focal neurologic deficits are appreciated. Skin:  Skin is warm, dry and intact. No rash noted. Psychiatric: Mood and affect are normal. Patient exhibits appropriate insight and judgment.  ____________________________________________    LABS (pertinent positives/negatives)  Labs Reviewed  COMPREHENSIVE METABOLIC PANEL - Abnormal; Notable for the following:    ALT 13 (*)    All other components within normal limits  CBC - Abnormal; Notable for the following:    WBC 11.1 (*)    All other components within normal limits  TROPONIN I  BRAIN NATRIURETIC PEPTIDE  TROPONIN I    ____________________________________________   EKG  ED ECG REPORT I, Jene Every, the attending physician, personally viewed and interpreted this ECG.  Date: 02/04/2015 EKG Time: 5 PM Rate: 56 Rhythm: Sinus bradycardia QRS Axis: normal Intervals: normal ST/T Wave abnormalities: normal Conduction Disutrbances: none Narrative Interpretation: unremarkable   ____________________________________________    RADIOLOGY I have personally reviewed any xrays that were ordered on this patient: Chest x-ray is  unremarkable  ____________________________________________   PROCEDURES  Procedure(s) performed: none  Critical Care performed: none  ____________________________________________   INITIAL IMPRESSION / ASSESSMENT AND PLAN / ED COURSE  Pertinent labs & imaging results that were available during my care of the patient were reviewed by me and considered in my medical decision making (see chart for details).  The patient is overall well-appearing in no distress. Chest x-ray is normal, her labs are unremarkable. Given this history of chest tightness and a history of diabetes and high blood pressure we will check  a second troponin. Although the EKG is unremarkable and I think ACS is extraordinarily unlikely. She is perc negative. I suspect this is upper respiratory infection related   Second troponin negative. Patient is pain-free. She'll follow-up with her cardiologist Dr. Lady GaryFath ____________________________________________   FINAL CLINICAL IMPRESSION(S) / ED DIAGNOSES  Final diagnoses:  Chest pain, unspecified chest pain type     Jene Everyobert Yasser Hepp, MD 02/04/15 2209

## 2015-02-27 DIAGNOSIS — R079 Chest pain, unspecified: Secondary | ICD-10-CM

## 2015-03-09 ENCOUNTER — Encounter: Payer: Self-pay | Admitting: Emergency Medicine

## 2015-03-09 ENCOUNTER — Ambulatory Visit
Admission: EM | Admit: 2015-03-09 | Discharge: 2015-03-09 | Disposition: A | Discharge status: Home / Self Care | Payer: Self-pay | Attending: Family Medicine | Admitting: Family Medicine

## 2015-03-09 DIAGNOSIS — J40 Bronchitis, not specified as acute or chronic: Secondary | ICD-10-CM | POA: Insufficient documentation

## 2015-03-09 DIAGNOSIS — I2510 Atherosclerotic heart disease of native coronary artery without angina pectoris: Secondary | ICD-10-CM | POA: Insufficient documentation

## 2015-03-09 DIAGNOSIS — E118 Type 2 diabetes mellitus with unspecified complications: Secondary | ICD-10-CM | POA: Insufficient documentation

## 2015-03-09 DIAGNOSIS — Z79899 Other long term (current) drug therapy: Secondary | ICD-10-CM | POA: Insufficient documentation

## 2015-03-09 DIAGNOSIS — Z87891 Personal history of nicotine dependence: Secondary | ICD-10-CM | POA: Insufficient documentation

## 2015-03-09 DIAGNOSIS — E785 Hyperlipidemia, unspecified: Secondary | ICD-10-CM | POA: Insufficient documentation

## 2015-03-09 DIAGNOSIS — Z7984 Long term (current) use of oral hypoglycemic drugs: Secondary | ICD-10-CM | POA: Insufficient documentation

## 2015-03-09 DIAGNOSIS — I509 Heart failure, unspecified: Secondary | ICD-10-CM | POA: Insufficient documentation

## 2015-03-09 DIAGNOSIS — I10 Essential (primary) hypertension: Secondary | ICD-10-CM | POA: Insufficient documentation

## 2015-03-09 DIAGNOSIS — Z8673 Personal history of transient ischemic attack (TIA), and cerebral infarction without residual deficits: Secondary | ICD-10-CM | POA: Insufficient documentation

## 2015-03-09 DIAGNOSIS — Z7982 Long term (current) use of aspirin: Secondary | ICD-10-CM | POA: Insufficient documentation

## 2015-03-09 DIAGNOSIS — G4733 Obstructive sleep apnea (adult) (pediatric): Secondary | ICD-10-CM | POA: Insufficient documentation

## 2015-03-09 DIAGNOSIS — J9801 Acute bronchospasm: Secondary | ICD-10-CM

## 2015-03-09 DIAGNOSIS — E669 Obesity, unspecified: Secondary | ICD-10-CM | POA: Insufficient documentation

## 2015-03-09 HISTORY — DX: Cardiomegaly: I51.7

## 2015-03-09 HISTORY — DX: Atherosclerotic heart disease of native coronary artery without angina pectoris: I25.10

## 2015-03-09 MED ORDER — BENZONATATE 200 MG PO CAPS: 200.0000 mg | ORAL_CAPSULE | Freq: Three times a day (TID) | ORAL | Status: DC | PRN

## 2015-03-09 MED ORDER — AZITHROMYCIN 250 MG PO TABS: ORAL_TABLET | ORAL | Status: DC

## 2015-03-09 NOTE — ED Provider Notes (Signed)
CSN: [id number]     Arrival date & time 03/09/15  4098 History   First MD Initiated Contact with Patient 03/09/15 1123     Chief Complaint  Patient presents with  . Cough   (Consider location/radiation/quality/duration/timing/severity/associated sxs/prior Treatment) HPI Comments: 54 yo female with a 4-5 days h/o productive cough, intermittent wheezing and chest discomfort.  Patient was seen in ED on 02/04/15 for chest pain and referred to cardiologist. Had a recent cardiac stress test in the last 2 weeks by cardiologist and was negative. Patient states that cardiologist told her symptoms were "lung related". Patient has a h/o smoking for over 20 years and quit smoking about 10 years ago. Denies any fevers, chills.  The history is provided by the patient.    Past Medical History  Diagnosis Date  . Type 2 diabetes mellitus (HCC)     on metformin  . Hypertension     on Lisinopril/ HCTZ  . Hyperlipidemia     on Lipitor  . Stroke [facility]) 2002    without any residual deficit  . Obesity   . GERD (gastroesophageal reflux disease)   . Chest pain 6/10    probable costochondritits  . OSA (obstructive sleep apnea)   . Dyspnea     Chronic, Echo 7/10: EF 55-60%, no valvular problems  . Dyslipidemia   . History of stroke   . Diabetes mellitus   . CHF (congestive heart failure) (HCC)   . Coronary artery disease   . Enlarged heart    Past Surgical History  Procedure Laterality Date  . Cardiac catheterization  2010   Family History  Problem Relation Age of Onset  . Heart attack Father 73   Social History  Substance Use Topics  . Smoking status: Former Games developer  . Smokeless tobacco: Former Neurosurgeon    Quit date: 03/12/1998  . Alcohol Use: No   OB History    No data available     Review of Systems  Allergies  Beta adrenergic blockers and Codeine  Home Medications   Prior to Admission medications   Medication Sig Start Date End Date Taking? Authorizing Provider  acetaminophen  (TYLENOL) 325 MG tablet Take 650 mg by mouth every 6 (six) hours as needed.   Yes Historical Provider, MD  allopurinol (ZYLOPRIM) 300 MG tablet Take 300 mg by mouth daily.   Yes Historical Provider, MD  amLODipine (NORVASC) 10 MG tablet Take 10 mg by mouth daily.   Yes Historical Provider, MD  furosemide (LASIX) 20 MG tablet Take 20 mg by mouth.   Yes Historical Provider, MD  lisinopril (PRINIVIL,ZESTRIL) 20 MG tablet Take 20 mg by mouth daily.   Yes Historical Provider, MD  meloxicam (MOBIC) 7.5 MG tablet Take 7.5 mg by mouth daily.   Yes Historical Provider, MD  aspirin 81 MG tablet Take 81 mg by mouth daily.      Historical Provider, MD  atorvastatin (LIPITOR) 20 MG tablet Take 20 mg by mouth daily.     Historical Provider, MD  azithromycin (ZITHROMAX Z-PAK) 250 MG tablet 2 tabs po once day 1, then 1 tab po qd for next 4 days 03/09/15   Payton Mccallum, MD  benzonatate (TESSALON) 200 MG capsule Take 1 capsule (200 mg total) by mouth 3 (three) times daily as needed for cough. 03/09/15   Payton Mccallum, MD  cephALEXin (KEFLEX) 500 MG capsule Take 500 mg by mouth 3 (three) times daily.    Historical Provider, MD  cyclobenzaprine (FLEXERIL) 5 MG tablet  Take 1 tablet (5 mg total) by mouth every 8 (eight) hours as needed for muscle spasms. 07/26/14   Charmayne Sheer Beers, PA-C  glipiZIDE (GLUCOTROL) 5 MG tablet Take 5 mg by mouth daily. Takes 1/2 tablet of 5 mg po daily    Historical Provider, MD  Hydrocodone-Guaifenesin (NARCOF PO) Take 5-325 mg by mouth every 6 (six) hours as needed.    Historical Provider, MD  ibuprofen (ADVIL,MOTRIN) 800 MG tablet Take 1 tablet (800 mg total) by mouth every 8 (eight) hours as needed. 07/26/14   Charmayne Sheer Beers, PA-C  lisinopril-hydrochlorothiazide (PRINZIDE,ZESTORETIC) 20-12.5 MG per tablet Take 1 tablet by mouth daily.      Historical Provider, MD  omeprazole (PRILOSEC) 20 MG capsule Take 20 mg by mouth daily.      Historical Provider, MD   Meds Ordered and Administered  this Visit  Medications - No data to display  BP 116/61 mmHg  Pulse 90  Temp(Src) 98 F (36.7 C) (Oral)  Resp 18  Ht  (1.727 m)  Wt 260 lb (117.935 kg)  BMI 39.54 kg/m2  SpO2 99% No data found.   Physical Exam  Constitutional: She appears well-developed and well-nourished. No distress.  HENT:  Head: Normocephalic and atraumatic.  Right Ear: Tympanic membrane, external ear and ear canal normal.  Left Ear: Tympanic membrane, external ear and ear canal normal.  Nose: Mucosal edema and rhinorrhea present. No nose lacerations, sinus tenderness, nasal deformity, septal deviation or nasal septal hematoma. No epistaxis.  No foreign bodies. Right sinus exhibits maxillary sinus tenderness and frontal sinus tenderness. Left sinus exhibits maxillary sinus tenderness and frontal sinus tenderness.  Mouth/Throat: Uvula is midline, oropharynx is clear and moist and mucous membranes are normal. No oropharyngeal exudate.  Eyes: Conjunctivae and EOM are normal. Pupils are equal, round, and reactive to light. Right eye exhibits no discharge. Left eye exhibits no discharge. No scleral icterus.  Neck: Normal range of motion. Neck supple. No thyromegaly present.  Cardiovascular: Normal rate, regular rhythm and normal heart sounds.   Pulmonary/Chest: Effort normal and breath sounds normal. No respiratory distress. She has no wheezes. She has no rales.  Few diffuse bilateral rhonchi  Lymphadenopathy:    She has no cervical adenopathy.  Skin: She is not diaphoretic.  Nursing note and vitals reviewed.   ED Course  Procedures (including critical care time)  Labs Review Labs Reviewed - No data to display  Imaging Review No results found.   Visual Acuity Review  Right Eye Distance:   Left Eye Distance:   Bilateral Distance:    Right Eye Near:   Left Eye Near:    Bilateral Near:       EKG: {ekg findings:315101::"normal EKG, normal sinus rhythm",no previous tracings available; reviewed  by me and agree with readout  MDM   1. Bronchitis   2. Bronchospasm    Discharge Medication List as of 03/09/2015 11:45 AM    START taking these medications   Details  azithromycin (ZITHROMAX Z-PAK) 250 MG tablet 2 tabs po once day 1, then 1 tab po qd for next 4 days, Normal    benzonatate (TESSALON) 200 MG capsule Take 1 capsule (200 mg total) by mouth 3 (three) times daily as needed for cough., Starting 03/09/2015, Until Discontinued, Normal       1. EKG results and diagnosis reviewed with patient  2. rx as per orders above; reviewed possible side effects, interactions, risks and benefits  3. Recommend supportive treatment with rest, increased  fluids 4. Follow-up prn if symptoms worsen or don't improve    Payton Mccallumrlando Armoni Kludt, MD 03/09/15 1521

## 2015-03-09 NOTE — ED Notes (Signed)
Pt here for left chest pain under breast. Also coughing and sinus drainage. Pt seen 2 weeks ago at ER with cp, told it was her heart, saw cardiologist.

## 2015-05-04 ENCOUNTER — Other Ambulatory Visit: Payer: Self-pay | Admitting: Family Medicine

## 2015-05-04 DIAGNOSIS — R600 Localized edema: Secondary | ICD-10-CM

## 2015-05-05 ENCOUNTER — Ambulatory Visit
Admission: RE | Admit: 2015-05-05 | Discharge: 2015-05-05 | Disposition: A | Discharge status: Home / Self Care | Payer: Self-pay | Source: Ambulatory Visit | Attending: Family Medicine | Admitting: Family Medicine

## 2015-05-05 DIAGNOSIS — R600 Localized edema: Secondary | ICD-10-CM | POA: Insufficient documentation

## 2015-12-07 ENCOUNTER — Emergency Department
Admission: EM | Admit: 2015-12-07 | Discharge: 2015-12-07 | Disposition: A | Discharge status: Home / Self Care | Payer: Self-pay | Attending: Emergency Medicine | Admitting: Emergency Medicine

## 2015-12-07 ENCOUNTER — Encounter: Payer: Self-pay | Admitting: Emergency Medicine

## 2015-12-07 DIAGNOSIS — E119 Type 2 diabetes mellitus without complications: Secondary | ICD-10-CM | POA: Insufficient documentation

## 2015-12-07 DIAGNOSIS — Z7984 Long term (current) use of oral hypoglycemic drugs: Secondary | ICD-10-CM | POA: Insufficient documentation

## 2015-12-07 DIAGNOSIS — Z87891 Personal history of nicotine dependence: Secondary | ICD-10-CM | POA: Insufficient documentation

## 2015-12-07 DIAGNOSIS — I2510 Atherosclerotic heart disease of native coronary artery without angina pectoris: Secondary | ICD-10-CM | POA: Insufficient documentation

## 2015-12-07 DIAGNOSIS — I509 Heart failure, unspecified: Secondary | ICD-10-CM | POA: Insufficient documentation

## 2015-12-07 DIAGNOSIS — M545 Low back pain: Secondary | ICD-10-CM | POA: Insufficient documentation

## 2015-12-07 DIAGNOSIS — Z791 Long term (current) use of non-steroidal anti-inflammatories (NSAID): Secondary | ICD-10-CM | POA: Insufficient documentation

## 2015-12-07 DIAGNOSIS — M7918 Myalgia, other site: Secondary | ICD-10-CM

## 2015-12-07 DIAGNOSIS — I110 Hypertensive heart disease with heart failure: Secondary | ICD-10-CM | POA: Insufficient documentation

## 2015-12-07 DIAGNOSIS — Z79899 Other long term (current) drug therapy: Secondary | ICD-10-CM | POA: Insufficient documentation

## 2015-12-07 DIAGNOSIS — Z7982 Long term (current) use of aspirin: Secondary | ICD-10-CM | POA: Insufficient documentation

## 2015-12-07 LAB — URINALYSIS COMPLETE WITH MICROSCOPIC (ARMC ONLY)
BILIRUBIN URINE: NEGATIVE
Bacteria, UA: NONE SEEN
GLUCOSE, UA: NEGATIVE mg/dL
Hgb urine dipstick: NEGATIVE
Ketones, ur: NEGATIVE mg/dL
Leukocytes, UA: NEGATIVE
Nitrite: NEGATIVE
Protein, ur: NEGATIVE mg/dL
RBC / HPF: NONE SEEN RBC/hpf (ref 0–5)
Specific Gravity, Urine: 1.006 (ref 1.005–1.030)
pH: 5 (ref 5.0–8.0)

## 2015-12-07 MED ORDER — CYCLOBENZAPRINE HCL 5 MG PO TABS: 5.0000 mg | ORAL_TABLET | Freq: Three times a day (TID) | ORAL | 0 refills | Status: DC | PRN

## 2015-12-07 MED ORDER — NAPROXEN 500 MG PO TABS: 500.0000 mg | ORAL_TABLET | Freq: Two times a day (BID) | ORAL | 0 refills | Status: DC

## 2015-12-07 NOTE — ED Triage Notes (Signed)
Pt with foul odor to urine for two days. Reports right flank pain.

## 2015-12-07 NOTE — ED Notes (Signed)
Pt ambulatory to restroom with steady gait noted  

## 2015-12-07 NOTE — ED Provider Notes (Signed)
[facility] Emergency Department Provider Note  ____________________________________________  Time seen: Approximately 2:30 PM  I have reviewed the triage vital signs and the nursing notes.   HISTORY  Chief Complaint Urinary Frequency    HPI Alicia Fischer is a 55 y.o. female , NAD, presents to the emergency department with 2 day history of lower back pain and malodorous urine. States she noted some mild lower back pain that can radiate to the left mid back over the last 2 days. Has also noted increased urinary frequency with a foul odor. Has not had any dysuria, hematuria. Denies any abdominal pain, flank pain, nausea, vomiting. Has not had any fevers, chills or body aches. Has not taken anything for her pain. Denies any injury or trauma to the back. Has not been lifting or moving objects out of the normal. 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 Does not know of any inciting incident that caused her pain. Has not had any saddle paresthesias nor loss of bowel or bladder control. Denies any numbness, weakness, tingling. Has not noted any rashes or skin sores.   Past Medical History:  Diagnosis Date  . Chest pain 6/10   probable costochondritits  . CHF (congestive heart failure) (HCC)   . Coronary artery disease   . Diabetes mellitus   . Dyslipidemia   . Dyspnea    Chronic, Echo 7/10: EF 55-60%, no valvular problems  . Enlarged heart   . GERD (gastroesophageal reflux disease)   . History of stroke   . Hyperlipidemia    on Lipitor  . Hypertension    on Lisinopril/ HCTZ  . Obesity   . OSA (obstructive sleep apnea)   . Stroke [facility]) 2002   without any residual deficit  . Type 2 diabetes mellitus (HCC)    on metformin    Patient Active Problem List   Diagnosis Date Noted  . SHORTNESS OF BREATH 09/15/2008  . HYPERLIPIDEMIA-MIXED 09/11/2008  . HYPERTENSION, BENIGN 09/11/2008  . CHEST  PAIN-UNSPECIFIED 09/11/2008    Past Surgical History:  Procedure Laterality Date  . CARDIAC CATHETERIZATION  2010    Prior to Admission medications   Medication Sig Start Date End Date Taking? Authorizing Provider  acetaminophen (TYLENOL) 325 MG tablet Take 650 mg by mouth every 6 (six) hours as needed.    Historical Provider, MD  allopurinol (ZYLOPRIM) 300 MG tablet Take 300 mg by mouth daily.    Historical Provider, MD  amLODipine (NORVASC) 10 MG tablet Take 10 mg by mouth daily.    Historical Provider, MD  aspirin 81 MG tablet Take 81 mg by mouth daily.      Historical Provider, MD  atorvastatin (LIPITOR) 20 MG tablet Take 20 mg by mouth daily.     Historical Provider, MD  azithromycin (ZITHROMAX Z-PAK) 250 MG tablet 2 tabs po once day 1, then 1 tab po qd for next 4 days 03/09/15   Payton Mccallum, MD  benzonatate (TESSALON) 200 MG capsule Take 1 capsule (200 mg total) by mouth 3 (three) times daily as needed for cough. 03/09/15   Payton Mccallum, MD  cephALEXin (KEFLEX) 500 MG capsule Take 500 mg by mouth 3 (three) times daily.    Historical Provider, MD  cyclobenzaprine (FLEXERIL) 5 MG tablet Take 1 tablet (5 mg total) by mouth 3 (three) times daily as needed for muscle spasms. 12/07/15   Jami L Hagler, PA-C  furosemide (LASIX) 20 MG  tablet Take 20 mg by mouth.    Historical Provider, MD  glipiZIDE (GLUCOTROL) 5 MG tablet Take 5 mg by mouth daily. Takes 1/2 tablet of 5 mg po daily    Historical Provider, MD  Hydrocodone-Guaifenesin (NARCOF PO) Take 5-325 mg by mouth every 6 (six) hours as needed.    Historical Provider, MD  ibuprofen (ADVIL,MOTRIN) 800 MG tablet Take 1 tablet (800 mg total) by mouth every 8 (eight) hours as needed. 07/26/14   Charmayne Sheer Beers, PA-C  lisinopril (PRINIVIL,ZESTRIL) 20 MG tablet Take 20 mg by mouth daily.    Historical Provider, MD  lisinopril-hydrochlorothiazide (PRINZIDE,ZESTORETIC) 20-12.5 MG per tablet Take 1 tablet by mouth daily.      Historical Provider, MD   meloxicam (MOBIC) 7.5 MG tablet Take 7.5 mg by mouth daily.    Historical Provider, MD  naproxen (NAPROSYN) 500 MG tablet Take 1 tablet (500 mg total) by mouth 2 (two) times daily with a meal. 12/07/15   Jami L Hagler, PA-C  omeprazole (PRILOSEC) 20 MG capsule Take 20 mg by mouth daily.      Historical Provider, MD    Allergies Beta adrenergic blockers and Codeine  Family History  Problem Relation Age of Onset  . Heart attack Father 52    Social History Social History  Substance Use Topics  . Smoking status: Former Games developer  . Smokeless tobacco: Former Neurosurgeon    Quit date: 03/12/1998  . Alcohol use No     Review of Systems  Constitutional: No fever/chills Cardiovascular: No chest pain. Respiratory: No shortness of breath.  Gastrointestinal: No abdominal pain nor flank pain.  No nausea, vomiting.  No diarrhea.  No constipation. Genitourinary: Positive malodorous urine, increased urinary frequency. Negative for dysuria, hematuria. No urinary hesitancy, urgency. Musculoskeletal: Positive for back pain.  Skin: Negative for rash, skin sores. Neurological: Negative for numbness, weakness, tingling. No saddle paresthesias, loss of bowel or bladder control. 10-point ROS otherwise negative.  ____________________________________________   PHYSICAL EXAM:  VITAL SIGNS: ED Triage Vitals  Enc Vitals Group     BP 12/07/15 1257 137/77     Pulse Rate 12/07/15 1257 80     Resp 12/07/15 1257 20     Temp 12/07/15 1257 97.9 F (36.6 C)     Temp Source 12/07/15 1257 Oral     SpO2 12/07/15 1257 96 %     Weight 12/07/15 1258 250 lb (113.4 kg)     Height 12/07/15 1258 5\' 8"  (1.727 m)     Head Circumference --      Peak Flow --      Pain Score 12/07/15 1258 5     Pain Loc --      Pain Edu? --      Excl. in GC? --      Constitutional: Alert and oriented. Well appearing and in no acute distress. Eyes: Conjunctivae are normal.  Head: Atraumatic. Neck: Supple with full range of motion. No  cervical spine tenderness to palpation. Hematological/Lymphatic/Immunilogical: No cervical lymphadenopathy. Cardiovascular: Normal rate, regular rhythm. Normal S1 and S2.  Good peripheral pulses bilaterally. Respiratory: Normal respiratory effort without tachypnea or retractions. Lungs CTAB with breath sounds noted in all lung fields. Gastrointestinal: Abdomen is obese but soft and nontender without distention or guarding in all quadrants. No CVA tenderness. Bowel sounds grossly normal active in all quadrants. Musculoskeletal: Tenderness to palpation about the left lateral mid and lower back without any central thoracic or lumbar tenderness. No SI joint tenderness with deep palpation  bilaterally. No lower extremity tenderness nor edema.  No joint effusions. Neurologic:  Normal speech and language. No gross focal neurologic deficits are appreciated.  Skin:  Skin is warm, dry and intact. No rash or skin sores noted. Psychiatric: Mood and affect are normal. Speech and behavior are normal. Patient exhibits appropriate insight and judgement.   ____________________________________________   LABS (all labs ordered are listed, but only abnormal results are displayed)  Labs Reviewed  URINALYSIS COMPLETEWITH MICROSCOPIC ([facility] ONLY) - Abnormal; Notable for the following:       Result Value   Color, Urine STRAW (*)    APPearance CLEAR (*)    Squamous Epithelial / LPF 0-5 (*)    All other components within normal limits   ____________________________________________  EKG  None ____________________________________________  RADIOLOGY  None ____________________________________________    PROCEDURES  Procedure(s) performed: None   Procedures   Medications - No data to display   ____________________________________________   INITIAL IMPRESSION / ASSESSMENT AND PLAN / ED COURSE  Pertinent labs & imaging results that were available during my care of the patient were reviewed by me  and considered in my medical decision making (see chart for details).  Clinical Course    Patient's diagnosis is consistent with Musculoskeletal pain. Patient will be discharged home with prescriptions for Naprosyn and Flexeril to take as directed. [facility] if symptoms persist past this treatment course. Patient is given ED precautions to return to the ED for any worsening or new symptoms.    ____________________________________________  FINAL CLINICAL IMPRESSION(S) / ED DIAGNOSES  Final diagnoses:  Musculoskeletal pain      NEW MEDICATIONS STARTED DURING THIS VISIT:  Discharge Medication List as of 12/07/2015  2:39 PM    START taking these medications   Details  naproxen (NAPROSYN) 500 MG tablet Take 1 tablet (500 mg total) by mouth 2 (two) times daily with a meal., Starting Wed 12/07/2015, Print             Ernestene KielJami L PooleHagler, PA-C 12/07/15 1540    Emily FilbertJonathan E Williams, MD 12/07/15 1725

## 2015-12-07 NOTE — ED Notes (Addendum)
Pt states back pain x 2 days and states now pain is coming up back to L shoulder. Denies injury. Denies hx of kidney stones, states may have a UTI.

## 2016-04-04 ENCOUNTER — Ambulatory Visit: Payer: Self-pay | Attending: Oncology

## 2016-04-04 ENCOUNTER — Ambulatory Visit
Admission: RE | Admit: 2016-04-04 | Discharge: 2016-04-04 | Disposition: A | Discharge status: Home / Self Care | Payer: Self-pay | Source: Ambulatory Visit | Attending: Oncology | Admitting: Oncology

## 2016-04-04 ENCOUNTER — Encounter (INDEPENDENT_AMBULATORY_CARE_PROVIDER_SITE_OTHER): Payer: Self-pay

## 2016-04-04 VITALS — BP 138/70 | HR 79 | Temp 97.9°F | Ht 68.11 in | Wt 281.2 lb

## 2016-04-04 DIAGNOSIS — Z0000 Encounter for general adult medical examination without abnormal findings: Secondary | ICD-10-CM

## 2016-04-04 NOTE — Progress Notes (Signed)
Subjective:     Patient ID: Alicia Fischer, female   DOB: Nov 16, 1960, 56 y.o.   MRN: [id number]  HPI   Review of Systems     Objective:   Physical Exam  Pulmonary/Chest: Right breast exhibits no inverted nipple, no mass, no nipple discharge, no skin change and no tenderness. Left breast exhibits no inverted nipple, no mass, no nipple discharge, no skin change and no tenderness. Breasts are symmetrical.  Large pendulous breasts  Genitourinary: No labial fusion. There is no rash, tenderness, lesion or injury on the right labia. There is no rash, tenderness, lesion or injury on the left labia. Uterus is deviated. Uterus is not enlarged, not fixed and not tender. Cervix exhibits no motion tenderness, no discharge and no friability. Right adnexum displays no mass, no tenderness and no fullness. Left adnexum displays no mass, no tenderness and no fullness. No erythema, tenderness or bleeding in the vagina. No foreign body in the vagina. No signs of injury around the vagina. No vaginal discharge found.       Assessment:     56 [facility].  Patient screened, and meets [facility] eligibility.  Patient does not have insurance, Medicare or Medicaid.  Handout given on Affordable Care Act.  Instructed patient on breast self-exam using teach back method.  CBE unremarkable.  No mass or lump palpated.  Pelvic exam normal. Cervix tilted up.    Plan:     Sent for bilateral screening mammogram.  Specimen collected for pap.

## 2016-04-06 LAB — PAP LB AND HPV HIGH-RISK
HPV, HIGH-RISK: NEGATIVE
PAP Smear Comment: 0

## 2016-04-16 NOTE — Progress Notes (Unsigned)
Phoned patient with normal mammogram, and negative/negative pap results.  Per guidelines next pap due in 5 years.  Patient to return in one year for annual mammogram screening.  Copy to [facility].

## 2017-04-21 ENCOUNTER — Encounter: Payer: Self-pay | Admitting: Emergency Medicine

## 2017-04-21 ENCOUNTER — Emergency Department
Admission: EM | Admit: 2017-04-21 | Discharge: 2017-04-21 | Disposition: A | Discharge status: Home / Self Care | Payer: Self-pay | Attending: Emergency Medicine | Admitting: Emergency Medicine

## 2017-04-21 ENCOUNTER — Emergency Department: Payer: Self-pay

## 2017-04-21 DIAGNOSIS — R0989 Other specified symptoms and signs involving the circulatory and respiratory systems: Secondary | ICD-10-CM | POA: Insufficient documentation

## 2017-04-21 DIAGNOSIS — R079 Chest pain, unspecified: Secondary | ICD-10-CM | POA: Insufficient documentation

## 2017-04-21 DIAGNOSIS — Z79899 Other long term (current) drug therapy: Secondary | ICD-10-CM | POA: Insufficient documentation

## 2017-04-21 DIAGNOSIS — M25512 Pain in left shoulder: Secondary | ICD-10-CM | POA: Insufficient documentation

## 2017-04-21 DIAGNOSIS — I110 Hypertensive heart disease with heart failure: Secondary | ICD-10-CM | POA: Insufficient documentation

## 2017-04-21 DIAGNOSIS — Z7984 Long term (current) use of oral hypoglycemic drugs: Secondary | ICD-10-CM | POA: Insufficient documentation

## 2017-04-21 DIAGNOSIS — J069 Acute upper respiratory infection, unspecified: Secondary | ICD-10-CM | POA: Insufficient documentation

## 2017-04-21 DIAGNOSIS — B9789 Other viral agents as the cause of diseases classified elsewhere: Secondary | ICD-10-CM | POA: Insufficient documentation

## 2017-04-21 DIAGNOSIS — R6884 Jaw pain: Secondary | ICD-10-CM | POA: Insufficient documentation

## 2017-04-21 DIAGNOSIS — Z7982 Long term (current) use of aspirin: Secondary | ICD-10-CM | POA: Insufficient documentation

## 2017-04-21 DIAGNOSIS — E119 Type 2 diabetes mellitus without complications: Secondary | ICD-10-CM | POA: Insufficient documentation

## 2017-04-21 DIAGNOSIS — I2510 Atherosclerotic heart disease of native coronary artery without angina pectoris: Secondary | ICD-10-CM | POA: Insufficient documentation

## 2017-04-21 DIAGNOSIS — Z8673 Personal history of transient ischemic attack (TIA), and cerebral infarction without residual deficits: Secondary | ICD-10-CM | POA: Insufficient documentation

## 2017-04-21 DIAGNOSIS — I509 Heart failure, unspecified: Secondary | ICD-10-CM | POA: Insufficient documentation

## 2017-04-21 DIAGNOSIS — Z87891 Personal history of nicotine dependence: Secondary | ICD-10-CM | POA: Insufficient documentation

## 2017-04-21 LAB — CBC
HEMATOCRIT: 44.2 % (ref 35.0–47.0)
Hemoglobin: 14.3 g/dL (ref 12.0–16.0)
MCH: 29.3 pg (ref 26.0–34.0)
MCHC: 32.4 g/dL (ref 32.0–36.0)
MCV: 90.7 fL (ref 80.0–100.0)
Platelets: 294 10*3/uL (ref 150–440)
RBC: 4.88 MIL/uL (ref 3.80–5.20)
RDW: 16 % — AB (ref 11.5–14.5)
WBC: 7.8 10*3/uL (ref 3.6–11.0)

## 2017-04-21 LAB — BASIC METABOLIC PANEL
Anion gap: 11 (ref 5–15)
BUN: 17 mg/dL (ref 6–20)
CALCIUM: 9.6 mg/dL (ref 8.9–10.3)
CO2: 24 mmol/L (ref 22–32)
Chloride: 102 mmol/L (ref 101–111)
Creatinine, Ser: 1 mg/dL (ref 0.44–1.00)
GFR calc Af Amer: >60 mL/min (ref >60)
GFR calc non Af Amer: >60 mL/min (ref >60)
GLUCOSE: 91 mg/dL (ref 65–99)
Potassium: 5.3 mmol/L — ABNORMAL HIGH (ref 3.5–5.1)
Sodium: 137 mmol/L (ref 135–145)

## 2017-04-21 LAB — TROPONIN I

## 2017-04-21 MED ORDER — PROMETHAZINE-DM 6.25-15 MG/5ML PO SYRP: 5.0000 mL | ORAL_SOLUTION | Freq: Every evening | ORAL | 0 refills | Status: DC | PRN

## 2017-04-21 NOTE — ED Provider Notes (Signed)
-----------------------------------------   11:03 AM on 04/21/2017 -----------------------------------------  X-ray to interpret EKG by flex, did not directly see this patient, sinus rhythm rate 75 bpm no acute ST elevation or depression normal axis, nonspecific ST changes, no acute ischemia.  RSR prime versus early right bundle branch block.   Jeanmarie PlantMcShane, James A, MD 04/21/17 1104

## 2017-04-21 NOTE — ED Notes (Signed)
This RN attempted IV/blood draw x 2.

## 2017-04-21 NOTE — Discharge Instructions (Signed)
Please make sure you are drinking lots of fluids.  Continue with daytime cough and cold medication.  Take nighttime medication as prescribed for cough.  Return to the ER for any worsening cough, chest pain, shortness of breath, worsening symptoms or urgent changes in health.

## 2017-04-21 NOTE — ED Triage Notes (Signed)
Pt reports increase cough, congestion x2 days. Reports pain when coughing. Has been taking mucinex without relief.

## 2017-04-21 NOTE — ED Provider Notes (Signed)
[facility]ENC[facility] Provider Note   CSN: [id number] Arrival date & time: 04/21/17  [id number]     History   Chief Complaint Chief Complaint  Patient presents with  . Cough    HPI Alicia Fischer is a 57 y.o. female presents to the emergency department for evaluation of cough congestion and runny nose times 2 days.  She denies any fevers, body aches or shortness of breath.  She does complain of some chest pain that lasts for 2-3 minutes at a time and it is described as a tightness.  Chest pain is been present for 1-2 days.  She believes this pain is present when she is coughing.  She also admits to having some left shoulder pain and left sided jaw pain.  She denies any dizziness, lightheadedness, vision changes.  Patient states her discomfort is only present when she coughs she denies any abdominal pain, lower extremity swelling, history of blood clots, long flights or car rides.  Patient states her cough is dry nonproductive.  She has been getting relief with over-the-counter Mucinex.  She is tolerating fluids well.  She is currently on aspirin has a history of cardiac stent in 2010.  HPI  Past Medical History:  Diagnosis Date  . Chest pain 6/10   probable costochondritits  . CHF (congestive heart failure) (HCC)   . Coronary artery disease   . Diabetes mellitus   . Dyslipidemia   . Dyspnea    Chronic, Echo 7/10: EF 55-60%, no valvular problems  . Enlarged heart   . GERD (gastroesophageal reflux disease)   . History of stroke   . Hyperlipidemia    on Lipitor  . Hypertension    on Lisinopril/ HCTZ  . Obesity   . OSA (obstructive sleep apnea)   . Stroke [facility](HCC) 2002   without any residual deficit  . Type 2 diabetes mellitus (HCC)    on metformin    Patient Active Problem List   Diagnosis Date Noted  . SHORTNESS OF BREATH 09/15/2008  . HYPERLIPIDEMIA-MIXED 09/11/2008  . HYPERTENSION, BENIGN 09/11/2008  . CHEST PAIN-UNSPECIFIED 09/11/2008     Past Surgical History:  Procedure Laterality Date  . BREAST BIOPSY Left 2014   neg  . CARDIAC CATHETERIZATION  2010    OB History    No data available       Home Medications    Prior to Admission medications   Medication Sig Start Date End Date Taking? Authorizing Provider  acetaminophen (TYLENOL) 325 MG tablet Take 650 mg by mouth every 6 (six) hours as needed.    [provider]  allopurinol (ZYLOPRIM) 300 MG tablet Take 300 mg by mouth daily.    [provider]  amLODipine (NORVASC) 10 MG tablet Take 10 mg by mouth daily.    [provider]  aspirin 81 MG tablet Take 81 mg by mouth daily.      [provider]  atorvastatin (LIPITOR) 20 MG tablet Take 20 mg by mouth daily.     [provider]  azithromycin (ZITHROMAX Z-PAK) 250 MG tablet 2 tabs po once day 1, then 1 tab po qd for next 4 days 03/09/15   Payton Mccallumonty, [place], MD  benzonatate (TESSALON) 200 MG capsule Take 1 capsule (200 mg total) by mouth 3 (three) times daily as needed for cough. 03/09/15   Payton Mccallumonty, [place], MD  cephALEXin (KEFLEX) 500 MG capsule Take 500 mg by mouth 3 (three) times daily.    [provider]  cyclobenzaprine (FLEXERIL) 5 MG tablet Take 1 tablet (5 mg total) by mouth 3 (three) times daily as needed for muscle spasms. 12/07/15   Hagler, Jami L, PA-C  furosemide (LASIX) 20 MG tablet Take 20 mg by mouth.    [provider]  glipiZIDE (GLUCOTROL) 5 MG tablet Take 5 mg by mouth daily. Takes 1/2 tablet of 5 mg po daily    [provider]  Hydrocodone-Guaifenesin (NARCOF PO) Take 5-325 mg by mouth every 6 (six) hours as needed.    [provider]  ibuprofen (ADVIL,MOTRIN) 800 MG tablet Take 1 tablet (800 mg total) by mouth every 8 (eight) hours as needed. 07/26/14   Beers, Charmayne Sheer, PA-C  lisinopril (PRINIVIL,ZESTRIL) 20 MG tablet Take 20 mg by mouth daily.    [provider]  lisinopril-hydrochlorothiazide  (PRINZIDE,ZESTORETIC) 20-12.5 MG per tablet Take 1 tablet by mouth daily.      [provider]  meloxicam (MOBIC) 7.5 MG tablet Take 7.5 mg by mouth daily.    [provider]  naproxen (NAPROSYN) 500 MG tablet Take 1 tablet (500 mg total) by mouth 2 (two) times daily with a meal. 12/07/15   Hagler, Jami L, PA-C  omeprazole (PRILOSEC) 20 MG capsule Take 20 mg by mouth daily.      [provider]  promethazine-dextromethorphan (PROMETHAZINE-DM) 6.25-15 MG/5ML syrup Take 5 mLs by mouth at bedtime as needed for cough. 04/21/17   Evon Slack, PA-C    Family History Family History  Problem Relation Age of Onset  . Heart attack Father 38  . Breast cancer Mother 46    Social History Social History   Tobacco Use  . Smoking status: Former Games developer  . Smokeless tobacco: Former Neurosurgeon    Quit date: 03/12/1998  Substance Use Topics  . Alcohol use: No  . Drug use: Not on file     Allergies   Beta adrenergic blockers and Codeine   Review of Systems Review of Systems  Constitutional: Negative for activity change, chills, fatigue and fever.  HENT: Positive for congestion and rhinorrhea. Negative for sinus pressure and sore throat.   Eyes: Negative for visual disturbance.  Respiratory: Positive for cough. Negative for chest tightness and shortness of breath.   Cardiovascular: Positive for chest pain (cough). Negative for leg swelling.  Gastrointestinal: Negative for abdominal pain, diarrhea, nausea and vomiting.  Genitourinary: Negative for dysuria.  Musculoskeletal: Negative for arthralgias, back pain and gait problem.  Skin: Negative for rash.  Neurological: Negative for weakness, light-headedness, numbness and headaches.  Hematological: Negative for adenopathy.  Psychiatric/Behavioral: Negative for agitation, behavioral problems and confusion.     Physical Exam Updated Vital Signs BP (!) 142/95 (BP Location: Left Arm)   Pulse 80   Temp 98.1 F (36.7 C)  (Oral)   Resp 17   Ht 5\' 8"  (1.727 m)   Wt 122.5 kg (270 lb)   SpO2 97%   BMI 41.05 kg/m   Physical Exam  Constitutional: She is oriented to person, place, and time. She appears well-developed and well-nourished. No distress.  HENT:  Head: Normocephalic and atraumatic.  Mouth/Throat: Oropharynx is clear and moist.  Eyes: EOM are normal. Pupils are equal, round, and reactive to light. Right eye exhibits no discharge. Left eye exhibits no discharge.  Neck: Normal range of motion. Neck supple.  Cardiovascular: Normal rate, regular rhythm and intact distal pulses.  Pulmonary/Chest: Effort normal and breath sounds normal. No respiratory distress. She exhibits tenderness (.  Mild tenderness left  mid sternal region with palpation).  Abdominal: Soft. She exhibits no distension. There is no tenderness.  Musculoskeletal: Normal range of motion. She exhibits no edema.  Left shoulder tender to palpation along the subacromial space and acromiohumeral joint.  Positive impingement signs.  No swelling throughout the left upper extremity.  Neurological: She is alert and oriented to person, place, and time. She has normal reflexes.  Skin: Skin is warm and dry.  Psychiatric: She has a normal mood and affect. Her behavior is normal. Thought content normal.     ED Treatments / Results  Labs (all labs ordered are listed, but only abnormal results are displayed) Labs Reviewed  BASIC METABOLIC PANEL - Abnormal; Notable for the following components:      Result Value   Potassium 5.3 (*)    All other components within normal limits  CBC - Abnormal; Notable for the following components:   RDW 16.0 (*)    All other components within normal limits  TROPONIN I    EKG  EKG Interpretation None       Radiology Dg Chest 2 View  Result Date: 04/21/2017 CLINICAL DATA:  Acute cough for 1 day. EXAM: CHEST  2 VIEW COMPARISON:  02/04/2015 and prior exams FINDINGS: Cardiomegaly and mild peribronchial  thickening are again noted. There is no evidence of focal airspace disease, pulmonary edema, suspicious pulmonary nodule/mass, pleural effusion, or pneumothorax. No acute bony abnormalities are identified. IMPRESSION: No evidence of acute cardiopulmonary disease. Cardiomegaly and mild chronic peribronchial thickening. Electronically Signed   By: Harmon Pier M.D.   On: 04/21/2017 11:56    Procedures Procedures (including critical care time)  Medications Ordered in ED Medications - No data to display   Initial Impression / Assessment and Plan / ED Course  I have reviewed the triage vital signs and the nursing notes.  Pertinent labs & imaging results that were available during my care of the patient were reviewed by me and considered in my medical decision making (see chart for details).     57 year old female with chief complaint of cough congestion runny nose times 2-3 days.  She did admit to having some chest pain, left shoulder pain.  EKG, chest x-ray, troponin and basic labs all within normal limits.  Chest and shoulder pain mostly after coughing.  Patient did have some musculoskeletal tenderness on exam.  Patient educated on viral upper respiratory infection and will continue with over-the-counter medications.  I did prescribe a stronger nighttime medication such as dextromethorphan with Phenergan to help her get some more rest at night.  She is encouraged to drink lots of fluids.  She is educated on signs and symptoms return to the ED for.  Final Clinical Impressions(s) / ED Diagnoses   Final diagnoses:  Viral URI with cough    ED Discharge Orders        Ordered    promethazine-dextromethorphan (PROMETHAZINE-DM) 6.25-15 MG/5ML syrup  At bedtime PRN     04/21/17 1215       Evon Slack, PA-C 04/21/17 1223    Merrily Brittle, MD 04/22/17 [phone]

## 2017-05-14 ENCOUNTER — Other Ambulatory Visit: Payer: Self-pay | Admitting: Family Medicine

## 2017-05-14 ENCOUNTER — Ambulatory Visit
Admission: RE | Admit: 2017-05-14 | Discharge: 2017-05-14 | Disposition: A | Discharge status: Home / Self Care | Payer: Self-pay | Source: Ambulatory Visit | Attending: Family Medicine | Admitting: Family Medicine

## 2017-05-14 DIAGNOSIS — M545 Low back pain: Secondary | ICD-10-CM | POA: Insufficient documentation

## 2018-05-26 ENCOUNTER — Ambulatory Visit: Payer: Self-pay

## 2018-06-16 ENCOUNTER — Ambulatory Visit: Payer: Self-pay

## 2018-07-14 ENCOUNTER — Ambulatory Visit (INDEPENDENT_AMBULATORY_CARE_PROVIDER_SITE_OTHER): Payer: Self-pay

## 2018-07-14 ENCOUNTER — Ambulatory Visit
Admission: EM | Admit: 2018-07-14 | Discharge: 2018-07-14 | Disposition: A | Discharge status: Home / Self Care | Payer: Self-pay

## 2018-07-14 ENCOUNTER — Encounter: Payer: Self-pay | Admitting: Emergency Medicine

## 2018-07-14 ENCOUNTER — Other Ambulatory Visit: Payer: Self-pay

## 2018-07-14 DIAGNOSIS — M25571 Pain in right ankle and joints of right foot: Secondary | ICD-10-CM

## 2018-07-14 DIAGNOSIS — S93401A Sprain of unspecified ligament of right ankle, initial encounter: Secondary | ICD-10-CM

## 2018-07-14 DIAGNOSIS — W06XXXA Fall from bed, initial encounter: Secondary | ICD-10-CM

## 2018-07-14 MED ORDER — NAPROXEN 500 MG PO TABS: 500.0000 mg | ORAL_TABLET | Freq: Two times a day (BID) | ORAL | 0 refills | Status: DC | PRN

## 2018-07-14 NOTE — ED Provider Notes (Signed)
[facility]    CSN: [id number] Arrival date & time: 07/14/18  1440  History   Chief Complaint Chief Complaint  Patient presents with  . Fall  . Leg Pain    right   HPI  58 year old female presents with right ankle pain.  Patient states that she got out of bed and accidentally fell.  She is unsure of exactly how she injured herself.  However, she reports that she is having pain particularly on the right lateral ankle.  Mild swelling.  Worse with activity and ambulation.  No relieving factors.  Patient also reports some calf pain.  No other associated symptoms.  Moderate in severity.  No other complaints.  PMH, Surgical Hx, Family Hx, Social History reviewed and updated as below.  Past Medical History:  Diagnosis Date  . Chest pain 6/10   probable costochondritits  . CHF (congestive heart failure) (HCC)   . Coronary artery disease   . Diabetes mellitus   . Dyslipidemia   . Dyspnea    Chronic, Echo 7/10: EF 55-60%, no valvular problems  . Enlarged heart   . GERD (gastroesophageal reflux disease)   . History of stroke   . Hyperlipidemia    on Lipitor  . Hypertension    on Lisinopril/ HCTZ  . Obesity   . OSA (obstructive sleep apnea)   . Stroke [facility](HCC) 2002   without any residual deficit  . Type 2 diabetes mellitus (HCC)    on metformin    Patient Active Problem List   Diagnosis Date Noted  . SHORTNESS OF BREATH 09/15/2008  . HYPERLIPIDEMIA-MIXED 09/11/2008  . HYPERTENSION, BENIGN 09/11/2008  . CHEST PAIN-UNSPECIFIED 09/11/2008    Past Surgical History:  Procedure Laterality Date  . BREAST BIOPSY Left 2014   neg  . CARDIAC CATHETERIZATION  2010    OB History   No obstetric history on file.      Home Medications    Prior to Admission medications   Medication Sig Start Date End Date Taking? Authorizing Provider  amLODipine (NORVASC) 10 MG tablet Take 10 mg by mouth daily.   Yes [provider]  aspirin 81 MG tablet Take 81 mg by  mouth daily.     Yes [provider]  atorvastatin (LIPITOR) 20 MG tablet Take 20 mg by mouth daily.    Yes [provider]  gabapentin (NEURONTIN) 300 MG capsule Take by mouth. 04/25/18  Yes [provider]  omeprazole (PRILOSEC) 20 MG capsule Take 20 mg by mouth daily.     Yes [provider]  acetaminophen (TYLENOL) 325 MG tablet Take 650 mg by mouth every 6 (six) hours as needed.    [provider]  naproxen (NAPROSYN) 500 MG tablet Take 1 tablet (500 mg total) by mouth 2 (two) times daily as needed for moderate pain. 07/14/18   Tommie Samsook, Narelle Schoening G, DO  promethazine-dextromethorphan (PROMETHAZINE-DM) 6.25-15 MG/5ML syrup Take 5 mLs by mouth at bedtime as needed for cough. 04/21/17   Evon SlackGaines, Thomas C, PA-C    Family History Family History  Problem Relation Age of Onset  . Heart attack Father 5054  . Breast cancer Mother 3170    Social History Social History   Tobacco Use  . Smoking status: Former Games developermoker  . Smokeless tobacco: Former NeurosurgeonUser    Quit date: 03/12/1998  Substance Use Topics  . Alcohol use: No  . Drug use: Never     Allergies   Beta adrenergic blockers and Codeine   Review  of Systems Review of Systems  Constitutional: Negative.   Musculoskeletal:       Ankle pain and swelling.   Physical Exam Triage Vital Signs ED Triage Vitals  Enc Vitals Group     BP 07/14/18 1457 (!) 151/75     Pulse Rate 07/14/18 1457 60     Resp 07/14/18 1457 16     Temp 07/14/18 1457 98 F (36.7 C)     Temp Source 07/14/18 1457 Oral     SpO2 07/14/18 1457 99 %     Weight 07/14/18 1452 267 lb (121.1 kg)     Height 07/14/18 1452 5\' 8"  (1.727 m)     Head Circumference --      Peak Flow --      Pain Score 07/14/18 1452 6     Pain Loc --      Pain Edu? --      Excl. in GC? --    Updated Vital Signs BP (!) 151/75 (BP Location: Left Arm)   Pulse 60   Temp 98 F (36.7 C) (Oral)   Resp 16   Ht 5\' 8"  (1.727 m)   Wt 121.1 kg   SpO2 99%   BMI 40.60  kg/m   Visual Acuity Right Eye Distance:   Left Eye Distance:   Bilateral Distance:    Right Eye Near:   Left Eye Near:    Bilateral Near:     Physical Exam Vitals signs and nursing note reviewed.  Constitutional:      Appearance: Normal appearance. She is obese.  HENT:     Head: Normocephalic and atraumatic.  Eyes:     General:        Right eye: No discharge.        Left eye: No discharge.     Conjunctiva/sclera: Conjunctivae normal.  Cardiovascular:     Rate and Rhythm: Normal rate and regular rhythm.  Pulmonary:     Effort: Pulmonary effort is normal. No respiratory distress.  Musculoskeletal:     Comments: Right ankle with mild tenderness over the lateral malleolus.  Mild swelling.  Decreased range of motion secondary to pain.  Neurological:     Mental Status: She is alert.  Psychiatric:        Mood and Affect: Mood normal.        Behavior: Behavior normal.    UC Treatments / Results  Labs (all labs ordered are listed, but only abnormal results are displayed) Labs Reviewed - No data to display  EKG None  Radiology Dg Ankle Complete Right  Result Date: 07/14/2018 CLINICAL DATA:  Recent fall with ankle pain, initial encounter EXAM: RIGHT ANKLE - COMPLETE 3+ VIEW COMPARISON:  None. FINDINGS: Generalized soft tissue swelling is noted about the ankle joint. No acute fracture or dislocation is noted. Calcaneal spurring is seen. IMPRESSION: Soft tissue swelling without acute bony abnormality. Electronically Signed   By: Alcide Clever M.D.   On: 07/14/2018 15:31    Procedures Procedures (including critical care time)  Medications Ordered in UC Medications - No data to display  Initial Impression / Assessment and Plan / UC Course  I have reviewed the triage vital signs and the nursing notes.  Pertinent labs & imaging results that were available during my care of the patient were reviewed by me and considered in my medical decision making (see chart for details).     58 year old female presents with a right ankle sprain.  Rest, ice, elevation.  Naproxen as  needed.  Supportive care.  Final Clinical Impressions(s) / UC Diagnoses   Final diagnoses:  Sprain of right ankle, unspecified ligament, initial encounter     Discharge Instructions     Rest, ice, elevation.  Xray negative.  Take care  Dr. Adriana Simas    ED Prescriptions    Medication Sig Dispense Auth. Provider   naproxen (NAPROSYN) 500 MG tablet Take 1 tablet (500 mg total) by mouth 2 (two) times daily as needed for moderate pain. 30 tablet Tommie Sams, DO     Controlled Substance Prescriptions [place] Controlled Substance Registry consulted? Not Applicable   Tommie Sams, DO 07/14/18 1709

## 2018-07-14 NOTE — ED Triage Notes (Signed)
Patient states that yesterday she fell when she was trying get out of bed and injured her right leg.  Patient c/o pain all down her right leg.

## 2018-07-14 NOTE — Discharge Instructions (Signed)
Rest, ice, elevation.  Xray negative.  Take care  Dr. Adriana Simas

## 2018-08-22 ENCOUNTER — Ambulatory Visit
Admission: RE | Admit: 2018-08-22 | Discharge: 2018-08-22 | Disposition: A | Discharge status: Home / Self Care | Payer: Self-pay | Source: Ambulatory Visit | Attending: Family Medicine | Admitting: Family Medicine

## 2018-08-22 ENCOUNTER — Other Ambulatory Visit: Payer: Self-pay | Admitting: Family Medicine

## 2018-08-22 ENCOUNTER — Other Ambulatory Visit: Payer: Self-pay

## 2018-08-22 ENCOUNTER — Ambulatory Visit
Admission: RE | Admit: 2018-08-22 | Discharge: 2018-08-22 | Disposition: A | Discharge status: Home / Self Care | Payer: Self-pay | Attending: Family Medicine | Admitting: Family Medicine

## 2018-08-22 DIAGNOSIS — M545 Low back pain, unspecified: Secondary | ICD-10-CM

## 2018-10-13 ENCOUNTER — Other Ambulatory Visit: Payer: Self-pay

## 2018-10-14 ENCOUNTER — Ambulatory Visit: Payer: Disability Insurance | Attending: Oncology | Admitting: *Deleted

## 2018-10-14 ENCOUNTER — Other Ambulatory Visit: Payer: Self-pay

## 2018-10-14 ENCOUNTER — Encounter: Payer: Self-pay | Admitting: *Deleted

## 2018-10-14 VITALS — BP 126/79 | HR 68 | Temp 97.0°F | Ht 67.0 in | Wt 278.0 lb

## 2018-10-14 DIAGNOSIS — N63 Unspecified lump in unspecified breast: Secondary | ICD-10-CM

## 2018-10-14 NOTE — Progress Notes (Signed)
  Subjective:     Patient ID: Alicia Fischer, female   DOB: 1961-01-17, 58 y.o.   MRN: [id number]  HPI   Review of Systems     Objective:   Physical Exam Chest:     Breasts:        Right: No swelling, bleeding, inverted nipple, mass, nipple discharge, skin change or tenderness.        Left: Mass and tenderness present. No swelling, bleeding, nipple discharge or skin change.    Abdominal:     Palpations: There is no hepatomegaly or splenomegaly.  Genitourinary:    Exam position: Lithotomy position.     Labia:        Right: Rash present. No tenderness, lesion or injury.        Left: Rash present. No tenderness, lesion or injury.      Urethra: No prolapse, urethral pain, urethral swelling or urethral lesion.     Cervix: No discharge, friability, lesion, erythema, cervical bleeding or eversion.     Uterus: Not deviated, not enlarged, not fixed, not tender and no uterine prolapse.     Lymphadenopathy:     Upper Body:     Right upper body: No supraclavicular or axillary adenopathy.     Left upper body: No supraclavicular or axillary adenopathy.        Assessment:     58 year old Black female referred from [facility] for annual screening.  Patient states she has all over left breast tenderness.  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  On clinical breast exam I can palpate an approximate 3 cm tender thickening 3 cm from the left axilla.  Patient states this area is very tender to palpation. There is no dominant mass, skin changes, nipple discharge or lymphadenopathy.  Taught self breast awareness.  Patient complains that she thinks she may have a "yeast infection".  States she has "burning on urination", denies urgency, hesitancy or any vaginal discharge.  States she tried over the counter products for yeast.  States she got better, but then it comes back.  States she has also been tested for UTI's and they have been  negative.  On clinical inspection of the vulva, there are white nodular lesions on the external labia.  There is a shiny, red excoriated area on bilateral inner labia and when touched, the patient states this is the area that burns and itches.  There is no notable vaginal discharge.  This could be a possible area of yeast.  Patient has been screened for eligibility.  She does not have any insurance, Medicare or Medicaid.  She also meets financial eligibility.  Hand-out given on the Affordable Care Act. Risk Assessment    Risk Scores      10/14/2018   Last edited by: Rico Junker, RN   5-year risk: 2.3 %   Lifetime risk: 11.1 %             Plan:     Will order bilateral diagnostic mammogram and ultrasound for the left breast thickening.  Will have Jeanella Anton schedule patient a gyn consult for her complaints of "yeast" like symptoms since over the counter meds have not worked, and patient is symptomatic.  Will follow-up per [facility] protocol.

## 2018-10-15 ENCOUNTER — Ambulatory Visit (INDEPENDENT_AMBULATORY_CARE_PROVIDER_SITE_OTHER): Payer: Self-pay | Admitting: Obstetrics and Gynecology

## 2018-10-15 ENCOUNTER — Encounter: Payer: Self-pay | Admitting: Obstetrics and Gynecology

## 2018-10-15 ENCOUNTER — Ambulatory Visit
Admission: RE | Admit: 2018-10-15 | Discharge: 2018-10-15 | Disposition: A | Discharge status: Home / Self Care | Payer: Self-pay | Source: Ambulatory Visit | Attending: Oncology | Admitting: Oncology

## 2018-10-15 VITALS — BP 93/56 | HR 61 | Ht 67.0 in | Wt 278.5 lb

## 2018-10-15 DIAGNOSIS — N63 Unspecified lump in unspecified breast: Secondary | ICD-10-CM

## 2018-10-15 DIAGNOSIS — B373 Candidiasis of vulva and vagina: Secondary | ICD-10-CM

## 2018-10-15 DIAGNOSIS — L900 Lichen sclerosus et atrophicus: Secondary | ICD-10-CM

## 2018-10-15 DIAGNOSIS — B3731 Acute candidiasis of vulva and vagina: Secondary | ICD-10-CM

## 2018-10-15 DIAGNOSIS — N898 Other specified noninflammatory disorders of vagina: Secondary | ICD-10-CM

## 2018-10-15 LAB — POCT URINALYSIS DIPSTICK
Bilirubin, UA: NEGATIVE
Blood, UA: NEGATIVE
Glucose, UA: NEGATIVE
Ketones, UA: NEGATIVE
Leukocytes, UA: NEGATIVE
Nitrite, UA: NEGATIVE
Protein, UA: NEGATIVE
Spec Grav, UA: 1.01
Urobilinogen, UA: 0.2 U/dL
pH, UA: 6.5

## 2018-10-15 MED ORDER — FLUCONAZOLE 150 MG PO TABS: 150.0000 mg | ORAL_TABLET | ORAL | 0 refills | Status: DC

## 2018-10-15 MED ORDER — CLOBETASOL PROPIONATE 0.05 % EX CREA: 1.0000 "application " | TOPICAL_CREAM | Freq: Two times a day (BID) | CUTANEOUS | 2 refills | Status: AC

## 2018-10-15 NOTE — Progress Notes (Signed)
Patient comes in as new patient today. Patient is having some vaginal itching and burning.

## 2018-10-15 NOTE — Progress Notes (Signed)
HPI:      Alicia Fischer is a 58 y.o. No obstetric history on file. who LMP was No LMP recorded. Patient is postmenopausal.  Subjective:   She presents today with complaint of 2 to 8453-month history of vaginal burning and itching.  She says that she tried over-the-counter antifungals without success.  She denies vaginal discharge.  She denies new sexual partner. Even though her history says that she is a diabetic she states that her sugars are normal and she takes no medications for this.    Hx: The following portions of the patient's history were reviewed and updated as appropriate:             She  has a past medical history of Chest pain (6/10), CHF (congestive heart failure) (HCC), Coronary artery disease, Diabetes mellitus, Dyslipidemia, Dyspnea, Enlarged heart, GERD (gastroesophageal reflux disease), History of stroke, Hyperlipidemia, Hypertension, Obesity, OSA (obstructive sleep apnea), Stroke (HCC) (2002), and Type 2 diabetes mellitus (HCC). She does not have any pertinent problems on file. She  has a past surgical history that includes Cardiac catheterization (2010) and Breast biopsy (Left, 2014). Her family history includes Breast cancer (age of onset: 6970) in her mother; Heart attack (age of onset: 7054) in her father. She  reports that she has quit smoking. She quit smokeless tobacco use about 20 years ago. She reports that she does not drink alcohol or use drugs. She has a current medication list which includes the following prescription(s): acetaminophen, amlodipine, aspirin, atorvastatin, omeprazole, clobetasol cream, fluconazole, gabapentin, naproxen, and promethazine-dextromethorphan. She is allergic to beta adrenergic blockers and codeine.       Review of Systems:  Review of Systems  Constitutional: Denied constitutional symptoms, night sweats, recent illness, fatigue, fever, insomnia and weight loss.  Eyes: Denied eye symptoms, eye pain, photophobia, vision change and visual  disturbance.  Ears/Nose/Throat/Neck: Denied ear, nose, throat or neck symptoms, hearing loss, nasal discharge, sinus congestion and sore throat.  Cardiovascular: Denied cardiovascular symptoms, arrhythmia, chest pain/pressure, edema, exercise intolerance, orthopnea and palpitations.  Respiratory: Denied pulmonary symptoms, asthma, pleuritic pain, productive sputum, cough, dyspnea and wheezing.  Gastrointestinal: Denied, gastro-esophageal reflux, melena, nausea and vomiting.  Genitourinary: See HPI for additional information.  Musculoskeletal: Denied musculoskeletal symptoms, stiffness, swelling, muscle weakness and myalgia.  Dermatologic: Denied dermatology symptoms, rash and scar.  Neurologic: Denied neurology symptoms, dizziness, headache, neck pain and syncope.  Psychiatric: Denied psychiatric symptoms, anxiety and depression.  Endocrine: Denied endocrine symptoms including hot flashes and night sweats.   Meds:   Current Outpatient Medications on File Prior to Visit  Medication Sig Dispense Refill  . acetaminophen (TYLENOL) 325 MG tablet Take 650 mg by mouth every 6 (six) hours as needed.    Marland Kitchen. amLODipine (NORVASC) 10 MG tablet Take 10 mg by mouth daily.    Marland Kitchen. aspirin 81 MG tablet Take 81 mg by mouth daily.      Marland Kitchen. atorvastatin (LIPITOR) 20 MG tablet Take 20 mg by mouth daily.     Marland Kitchen. omeprazole (PRILOSEC) 20 MG capsule Take 20 mg by mouth daily.      Marland Kitchen. gabapentin (NEURONTIN) 300 MG capsule Take by mouth.    . naproxen (NAPROSYN) 500 MG tablet Take 1 tablet (500 mg total) by mouth 2 (two) times daily as needed for moderate pain. (Patient not taking: Reported on 10/15/2018) 30 tablet 0  . promethazine-dextromethorphan (PROMETHAZINE-DM) 6.25-15 MG/5ML syrup Take 5 mLs by mouth at bedtime as needed for cough. (Patient not taking: Reported on  10/15/2018) 60 mL 0   No current facility-administered medications on file prior to visit.     Objective:     Vitals:   10/15/18 0946  BP: (!) 93/56   Pulse: 61              Physical examination   Pelvic:   Vulva:  Erythematous and irritated looking- some white areas as well.  Vagina: No lesions or abnormalities noted.  Support: Normal pelvic support.  Urethra No masses tenderness or scarring.  Meatus Normal size without lesions or prolapse.  Cervix: Normal appearance.  No lesions.  Anus: Normal exam.  No lesions.  Perineum: Normal exam.  No lesions.   WET PREP: clue cells: absent, KOH (yeast): negative, odor: absent and trichomoniasis: negative Ph:  < 4.5   Assessment:    No obstetric history on file. Patient Active Problem List   Diagnosis Date Noted  . SHORTNESS OF BREATH 09/15/2008  . HYPERLIPIDEMIA-MIXED 09/11/2008  . HYPERTENSION, BENIGN 09/11/2008  . CHEST PAIN-UNSPECIFIED 09/11/2008     1. Lichen sclerosus   2. Vaginal itching   3. Monilial vulvitis        Plan:            1.  We will treat for both chronic yeast infection and lichen sclerosis simultaneously.  I expect significant improvement in the next 4 weeks. Orders Orders Placed This Encounter  Procedures  . POCT urinalysis dipstick     Meds ordered this encounter  Medications  . fluconazole (DIFLUCAN) 150 MG tablet    Sig: Take 1 tablet (150 mg total) by mouth once a week. For 4 doses    Dispense:  4 tablet    Refill:  0  . clobetasol cream (TEMOVATE) 0.05 %    Sig: Apply 1 application topically 2 (two) times daily. Use for 30 days twice a day as directed then use daily as directed.    Dispense:  60 g    Refill:  2      F/U  Return in about 4 weeks (around 11/12/2018). I spent 32 minutes involved in the care of this patient of which greater than 50% was spent discussing chronic monilia vulvovaginitis, relationship of monilia and diabetes, lichen sclerosus its chronic nature and treatment, effective inflammation and itching on perpetuation of lichen sclerosis.  All patient's questions answered.  Finis Bud, M.D. 10/15/2018 10:28  AM

## 2018-10-17 ENCOUNTER — Other Ambulatory Visit: Payer: Self-pay | Admitting: Internal Medicine

## 2018-10-17 ENCOUNTER — Ambulatory Visit
Admission: RE | Admit: 2018-10-17 | Discharge: 2018-10-17 | Disposition: A | Discharge status: Home / Self Care | Payer: Disability Insurance | Source: Ambulatory Visit | Attending: Internal Medicine | Admitting: Internal Medicine

## 2018-10-17 DIAGNOSIS — M5416 Radiculopathy, lumbar region: Secondary | ICD-10-CM

## 2018-10-21 ENCOUNTER — Encounter: Payer: Self-pay | Admitting: *Deleted

## 2018-10-21 NOTE — Progress Notes (Signed)
Called patient today to review her mammogram results and follow up on her yeast like infection.  Patient states she only has breast pain when "you press on it".  Mammogram showed normal lymph nodes at the area of left breast thickening.  Asked patient if she would like to return in a couple of months for a repeat breast exam to ensure stability.  She is agreeable.  I have scheduled her to return on 12/23/18 @ 11:30.  Patient states she is a little better after using the medication prescribed for her yeast like vaginal / vulva infection.  She has a follow up with Dr. Amalia Hailey next month.  [facility] to [place].

## 2018-11-12 ENCOUNTER — Encounter: Payer: Self-pay | Admitting: Obstetrics and Gynecology

## 2018-12-22 ENCOUNTER — Other Ambulatory Visit: Payer: Self-pay

## 2018-12-23 ENCOUNTER — Other Ambulatory Visit: Payer: Self-pay

## 2018-12-23 ENCOUNTER — Ambulatory Visit: Payer: Disability Insurance | Attending: Oncology | Admitting: *Deleted

## 2018-12-23 VITALS — BP 146/65 | HR 63 | Temp 98.0°F | Ht 67.0 in | Wt 294.0 lb

## 2018-12-23 DIAGNOSIS — N644 Mastodynia: Secondary | ICD-10-CM

## 2018-12-23 NOTE — Progress Notes (Signed)
  Subjective:     Patient ID: Alicia Fischer, female   DOB: [birth year], 58 y.o.   MRN: [id number]  HPI   Review of Systems     Objective:   Physical Exam Chest:     Breasts:        Right: No swelling, bleeding, inverted nipple, mass, nipple discharge, skin change or tenderness.        Left: Tenderness present. No swelling, bleeding, inverted nipple, mass, nipple discharge or skin change.          Assessment:     58 year old Black female returns to [facility] for repeat clinical breast exam. 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  Today she states it is more targeted under the breast and points to the inframmary ridge area of the left breast.  On clinical breast exam there is no dominant mass, skin changes, nipple discharge or lymphadenopathy.  Patient states the tenderness is constant and has been present for months.  In semi-fowlers position the patient has me feel the area of concern and puts her hand on the area.  I can palpate her rib, since the area is right under the left breast along the rib cage.  Patient is agreeable this could be one of her ribs we are feeling.      Plan:     Since her last mammogram was a birads1, and there is no palpable finding, I discussed the need to follow up with her primary care if the left breast / rib pain persist.  She is agreeable.  Will follow-up in one year with annual exam.

## 2019-04-01 ENCOUNTER — Other Ambulatory Visit: Payer: Self-pay | Admitting: Family Medicine

## 2019-04-01 DIAGNOSIS — I6529 Occlusion and stenosis of unspecified carotid artery: Secondary | ICD-10-CM

## 2019-04-07 ENCOUNTER — Ambulatory Visit
Admission: RE | Admit: 2019-04-07 | Discharge: 2019-04-07 | Disposition: A | Discharge status: Home / Self Care | Payer: Self-pay | Source: Ambulatory Visit | Attending: Family Medicine | Admitting: Family Medicine

## 2019-04-07 ENCOUNTER — Other Ambulatory Visit: Payer: Self-pay

## 2019-04-07 DIAGNOSIS — I6529 Occlusion and stenosis of unspecified carotid artery: Secondary | ICD-10-CM | POA: Insufficient documentation

## 2019-04-14 ENCOUNTER — Other Ambulatory Visit: Payer: Self-pay

## 2019-04-14 ENCOUNTER — Encounter (INDEPENDENT_AMBULATORY_CARE_PROVIDER_SITE_OTHER): Payer: Self-pay | Admitting: Vascular Surgery

## 2019-04-14 ENCOUNTER — Ambulatory Visit (INDEPENDENT_AMBULATORY_CARE_PROVIDER_SITE_OTHER): Payer: Self-pay | Admitting: Vascular Surgery

## 2019-04-14 VITALS — BP 146/80 | HR 80 | Resp 16 | Wt 296.0 lb

## 2019-04-14 DIAGNOSIS — I6523 Occlusion and stenosis of bilateral carotid arteries: Secondary | ICD-10-CM

## 2019-04-14 DIAGNOSIS — I10 Essential (primary) hypertension: Secondary | ICD-10-CM

## 2019-04-14 DIAGNOSIS — I6529 Occlusion and stenosis of unspecified carotid artery: Secondary | ICD-10-CM | POA: Insufficient documentation

## 2019-04-14 DIAGNOSIS — E785 Hyperlipidemia, unspecified: Secondary | ICD-10-CM

## 2019-04-14 DIAGNOSIS — E1151 Type 2 diabetes mellitus with diabetic peripheral angiopathy without gangrene: Secondary | ICD-10-CM

## 2019-04-14 NOTE — Assessment & Plan Note (Signed)
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 We had a long discussion today about the pathophysiology and natural history of carotid disease.  We discussed that if the left carotid artery is in fact occluded, no intervention is appropriate for a chronic occlusion of the carotid artery.  We discussed that if the right carotid stenosis is in the 70% range, that would be in consideration for intervention with either surgery or stenting.  We discussed that lesion should be 70% or more in with a history of stroke in a relatively young patient I do think it would be reasonable to consider repair at that point.  At this time, the prudent next step would be a CT angiogram of the carotids arteries to further elucidate her disease.  She is on appropriate antiplatelet and statin agent.  I will see her back following the CT angiogram to discuss the results and determine further treatment options.

## 2019-04-14 NOTE — Assessment & Plan Note (Signed)
lipid control important in reducing the progression of atherosclerotic disease. Continue statin therapy  

## 2019-04-14 NOTE — Assessment & Plan Note (Signed)
blood pressure control important in reducing the progression of atherosclerotic disease. On appropriate oral medications.  

## 2019-04-14 NOTE — Progress Notes (Signed)
Patient ID: Alicia Fischer, female   DOB: 1960-12-17, 59 y.o.   MRN: [id number]  Chief Complaint  Patient presents with   New Patient (Initial Visit)    ref cronk. 70% carotid stenosis    HPI Alicia Fischer is a 59 y.o. female.  I am asked to see the patient by Dr. Foye Deer for evaluation of carotid stenosis.  According to the patient, she has been complaining of neck pain for several months.  She has previous history of stroke going back to 2001.  She does not have any obvious residual stroke symptoms that I can appreciate.  She does not have any recent focal neurologic symptoms such as arm or leg weakness or numbness, speech or swallowing difficulty, or temporary monocular blindness.  The pain is constant and nothing is really made it better.  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  Given these findings, she is referred for further evaluation and treatment.     Past Medical History:  Diagnosis Date   Chest pain 6/10   probable costochondritits   CHF (congestive heart failure) ([place])    Coronary artery disease    Diabetes mellitus    Dyslipidemia    Dyspnea    Chronic, Echo 7/10: EF 55-60%, no valvular problems   Enlarged heart    GERD (gastroesophageal reflux disease)    History of stroke    Hyperlipidemia    on Lipitor   Hypertension    on Lisinopril/ HCTZ   Obesity    OSA (obstructive sleep apnea)    Stroke ([place]) 2002   without any residual deficit   Type 2 diabetes mellitus ([place])    on metformin    Past Surgical History:  Procedure Laterality Date   BREAST BIOPSY Left 2014   neg   CARDIAC CATHETERIZATION  2010     Family History  Problem Relation Age of Onset   Heart attack Father 35   Breast cancer Mother 8  No bleeding disorders, clotting disorders, or aneurysms   Social History   Tobacco Use   Smoking status: Former Smoker    Smokeless tobacco: Former Systems developer    Quit date: 03/12/1998  Substance Use Topics   Alcohol use: No   Drug use: Never     Allergies  Allergen Reactions   Beta Adrenergic Blockers    Codeine    Contrast Media  [Iodinated Diagnostic Agents]     Other reaction(s): Unknown    Current Outpatient Medications  Medication Sig Dispense Refill   acetaminophen (TYLENOL) 325 MG tablet Take 650 mg by mouth every 6 (six) hours as needed.     amLODipine (NORVASC) 10 MG tablet Take 10 mg by mouth daily.     aspirin 81 MG tablet Take 81 mg by mouth daily.       atorvastatin (LIPITOR) 80 MG tablet Take 80 mg by mouth daily.      cetirizine (ZYRTEC) 10 MG tablet Take 10 mg by mouth daily.     gabapentin (NEURONTIN) 300 MG capsule Take by mouth.     losartan (COZAAR) 100 MG tablet Take by mouth.     meloxicam (MOBIC) 15 MG tablet Take by mouth.     fluconazole (DIFLUCAN) 150 MG tablet Take 1 tablet (150 mg total) by mouth once a week. For 4 doses (Patient not taking: Reported on 04/14/2019) 4 tablet 0   naproxen (NAPROSYN) 500 MG  tablet Take 1 tablet (500 mg total) by mouth 2 (two) times daily as needed for moderate pain. (Patient not taking: Reported on 10/15/2018) 30 tablet 0   omeprazole (PRILOSEC) 20 MG capsule Take 20 mg by mouth daily.       promethazine-dextromethorphan (PROMETHAZINE-DM) 6.25-15 MG/5ML syrup Take 5 mLs by mouth at bedtime as needed for cough. (Patient not taking: Reported on 10/15/2018) 60 mL 0   No current facility-administered medications for this visit.      REVIEW OF SYSTEMS (Negative unless checked)  Constitutional: [] Weight loss  [] Fever  [] Chills Cardiac: [x] Chest pain   [] Chest pressure   [] Palpitations   [] Shortness of breath when laying flat   [] Shortness of breath at rest   [x] Shortness of breath with exertion. Vascular:  [] Pain in legs with walking   [] Pain in legs at rest   [] Pain in legs when laying flat   [] Claudication   [] Pain in feet when walking   [] Pain in feet at rest  [] Pain in feet when laying flat   [] History of DVT   [] Phlebitis   [] Swelling in legs   [] Varicose veins   [] Non-healing ulcers Pulmonary:   [] Uses home oxygen   [] Productive cough   [] Hemoptysis   [] Wheeze  [] COPD   [] Asthma Neurologic:  [] Dizziness  [] Blackouts   [] Seizures   [x] History of stroke   [] History of TIA  [] Aphasia   [] Temporary blindness   [] Dysphagia   [] Weakness or numbness in arms   [] Weakness or numbness in legs Musculoskeletal:  [x] Arthritis   [] Joint swelling   [x] Joint pain   [] Low back pain Hematologic:  [] Easy bruising  [] Easy bleeding   [] Hypercoagulable state   [] Anemic  [] Hepatitis Gastrointestinal:  [] Blood in stool   [] Vomiting blood  [x] Gastroesophageal reflux/heartburn   [] Abdominal pain Genitourinary:  [] Chronic kidney disease   [] Difficult urination  [] Frequent urination  [] Burning with urination   [] Hematuria Skin:  [] Rashes   [] Ulcers   [] Wounds Psychological:  [] History of anxiety   []  History of major depression.    Physical Exam BP (!) 146/80 (BP Location: Right Arm)    Pulse 80    Resp 16    Wt 296 lb (134.3 kg)    BMI 46.36 kg/m  Gen:  WD/WN, NAD.  Obese Head: [place]/AT, No temporalis wasting.  Ear/Nose/Throat: Hearing grossly intact, nares w/o erythema or drainage, oropharynx w/o Erythema/Exudate Eyes: Conjunctiva clear, sclera non-icteric  Neck: trachea midline.  Bilateral carotid bruits present. Pulmonary:  Good air movement, respirations not labored, no use of accessory muscles  Cardiac: RRR, no JVD Vascular:  Vessel Right Left  Radial Palpable Palpable                                   Gastrointestinal:. No masses, surgical incisions, or scars. Musculoskeletal: M/S 5/5 throughout.  Extremities without ischemic changes.  No deformity or atrophy.  Mild lower extremity edema. Neurologic: Sensation grossly intact in extremities.  Symmetrical.  Speech is fluent. Motor exam as listed above. Psychiatric: Judgment intact,  Mood & affect appropriate for pt's clinical situation. Dermatologic: No rashes or ulcers noted.  No cellulitis or open wounds.    Radiology Carotid Bilateral  Result Date: 04/07/2019 CLINICAL DATA:  History of left ICA occlusion. EXAM: BILATERAL CAROTID DUPLEX ULTRASOUND TECHNIQUE: scale imaging, color Doppler and duplex ultrasound were performed of bilateral carotid and vertebral arteries in the neck. COMPARISON:  Report from a  prior study dated 04/25/1999 FINDINGS: Criteria: Quantification of carotid stenosis is based on velocity parameters that correlate the residual internal carotid diameter with NASCET-based stenosis levels, using the diameter of the distal internal carotid lumen as the denominator for stenosis measurement. The following velocity measurements were obtained: RIGHT ICA:  254/74 cm/sec CCA:  84/21 cm/sec SYSTOLIC ICA/CCA RATIO:  3.0 ECA:  151 cm/sec LEFT ICA:  19/4 cm/sec CCA:  83/8 cm/sec SYSTOLIC ICA/CCA RATIO:  0.2 ECA:  100 cm/sec RIGHT CAROTID ARTERY: Moderate calcified plaque is present at the level of the distal bulb and proximal to mid right ICA. Velocity elevation likely corresponds to a near 70% stenosis. Some degree of velocity elevation is expected due to left ICA occlusion/near occlusion. RIGHT VERTEBRAL ARTERY: Antegrade flow with normal waveform and velocity. LEFT CAROTID ARTERY: The left ICA appears to be essentially occluded at its origin. There is a minimal amount of potential detected flow but this is difficult to reproduce and visualized by color Doppler. The left ICA is likely chronically occluded but a tiny amount of trickle flow cannot be entirely excluded by ultrasound. LEFT VERTEBRAL ARTERY: Antegrade flow with normal waveform and velocity. IMPRESSION: 1. Plaque and stenosis involving the proximal right internal carotid artery likely corresponding to a near 70% stenosis. 2. 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 However, a tiny amount of trickle  flow cannot be entirely excluded by duplex ultrasound. Consider CTA evaluation. Electronically Signed   By: Irish Lack M.D.   On: 04/07/2019 13:45    Labs No results found for this or any previous visit (from the past 2160 hour(s)).  Assessment/Plan:  HYPERTENSION, BENIGN blood pressure control important in reducing the progression of atherosclerotic disease. On appropriate oral medications.   Diabetes (HCC) blood glucose control important in reducing the progression of atherosclerotic disease. Also, involved in wound healing. On appropriate medications.   Hyperlipidemia lipid control important in reducing the progression of atherosclerotic disease. 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 We had a long discussion today about the pathophysiology and natural history of carotid disease.  We discussed that if the left carotid artery is in fact occluded, no intervention is appropriate for a chronic occlusion of the carotid artery.  We discussed that if the right carotid stenosis is in the 70% range, that would be in consideration for intervention with either surgery or stenting.  We discussed that lesion should be 70% or more in with a history of stroke in a relatively young patient I do think it would be reasonable to consider repair at that point.  At this time, the prudent next step would be a CT angiogram of the carotids arteries to further elucidate her disease.  She is on appropriate antiplatelet and statin agent.  I will see her back following the CT angiogram to discuss the results and determine further treatment options.      Festus Barren 04/14/2019, 1:36 PM   This note was created with Dragon medical transcription system.  Any errors from dictation are unintentional.

## 2019-04-14 NOTE — Patient Instructions (Signed)
Carotid Artery Disease  Carotid artery disease is the narrowing or blockage of one or both carotid arteries. This condition is also called carotid artery stenosis. The carotid arteries are the two main blood vessels on either side of the neck. They send blood to the brain, other parts of the head, and the neck.  This condition increases your risk for a stroke or a transient ischemic attack (TIA). A TIA is a "mini-stroke" that causes stroke-like symptoms that go away quickly. What are the causes? This condition is mainly caused by a narrowing and hardening of the carotid arteries. The carotid arteries can become narrow or clogged with a buildup of plaque. Plaque includes:  Fat.  Cholesterol.  Calcium.  Other substances. What increases the risk? The following factors may make you more likely to develop this condition:  Having certain medical conditions, such as: ? High cholesterol. ? High blood pressure. ? Diabetes. ? Obesity.  Smoking.  A family history of cardiovascular disease.  Not being active or lack of regular exercise.  Being female. Men have a higher risk of having arteries become narrow and harden earlier in life than women.  Old age. What are the signs or symptoms? This condition may not have any signs or symptoms until a stroke or TIA happens. In some cases, your doctor may be able to hear a whooshing sound. This can suggest a change in blood flow caused by plaque buildup. An eye exam can also help find signs of the condition. How is this treated? This condition may be treated with more than one treatment. Treatment options include:  Lifestyle changes, such as: ? Quitting smoking. ? Getting regular exercise, or getting exercise as told by your doctor. ? Eating a healthy diet. ? Managing stress. ? Keeping a healthy weight.  Medicines to control: ? Blood pressure. ? Cholesterol. ? Blood clotting.  Surgery. You may have: ? A surgery to remove the blockages in  the carotid arteries. ? A procedure in which a small mesh tube (stent) is used to widen the blocked carotid arteries. Follow these instructions at home: Eating and drinking Follow instructions about your diet from your doctor. It is important to follow a healthy diet.  Eat a diet that includes: ? A lot of fresh fruits and vegetables. ? Low-fat (lean) meats.  Avoid these foods: ? Foods that are high in fat. ? Foods that are high in salt (sodium). ? Foods that are fried. ? Foods that are processed. ? Foods that have few good nutrients (poor nutritional value).  Lifestyle   Keep a healthy weight.  Do exercises as told by your doctor to stay active. Each week, you should get one of the following: ? At least 150 minutes of exercise that raises your heart rate and makes you sweat (moderate-intensity exercise). ? At least 75 minutes of exercise that takes a lot of effort.  Do not use any products that contain nicotine or tobacco, such as cigarettes, e-cigarettes, and chewing tobacco. If you need help quitting, ask your doctor.  Do not drink alcohol if: ? Your doctor tells you not to drink. ? You are pregnant, may be pregnant, or are planning to become pregnant.  If you drink alcohol: ? Limit how much you use to:  0-1 drink a day for women.  0-2 drinks a day for men. ? Be aware of how much alcohol is in your drink. In the [place] one drink equals one 12 oz bottle of beer (355 mL), one 5   oz glass of wine (148 mL), or one 1 oz glass of hard liquor (44 mL).  Do not use drugs.  Manage your stress. Ask your doctor for tips on how to do this. General instructions  Take over-the-counter and prescription medicines only as told by your doctor.  Keep all follow-up visits as told by your doctor. This is important. Where to find more information  American Heart Association: www.heart.org Get help right away if:  You have any signs of a stroke. "BE FAST" is an easy way to remember the  main warning signs: ? B - Balance. Signs are dizziness, sudden trouble walking, or loss of balance. ? E - Eyes. Signs are trouble seeing or a change in how you see. ? F - Face. Signs are sudden weakness or loss of feeling of the face, or the face or eyelid drooping on one side. ? A - Arms. Signs are weakness or loss of feeling in an arm. This happens suddenly and usually on one side of the body. ? S - Speech. Signs are sudden trouble speaking, slurred speech, or trouble understanding what people say. ? T - Time. Time to call emergency services. Write down what time symptoms started.  You have other signs of a stroke, such as: ? A sudden, very bad headache with no known cause. ? Feeling like you may vomit (nausea). ? Vomiting. ? A seizure. These symptoms may be an emergency. Do not wait to see if the symptoms will go away. Get medical help right away. Call your local emergency services (911 in the [place] Do not drive yourself to the hospital. Summary  The carotid arteries are blood vessels on both sides of the neck.  If these arteries get smaller or get blocked, you are more likely to have a stroke or a mini-stroke.  This condition can be treated with lifestyle changes, medicines, surgery, or a blend of these treatments.  Get help right away if you have any signs of a stroke. "BE FAST" is an easy way to remember the main warning signs of stroke. This information is not intended to replace advice given to you by your health care provider. Make sure you discuss any questions you have with your health care provider. Document Revised: 09/22/2018 Document Reviewed: 09/22/2018 Elsevier Patient Education  2020 [employer]  

## 2019-04-14 NOTE — Assessment & Plan Note (Signed)
blood glucose control important in reducing the progression of atherosclerotic disease. Also, involved in wound healing. On appropriate medications.  

## 2019-04-23 ENCOUNTER — Telehealth (INDEPENDENT_AMBULATORY_CARE_PROVIDER_SITE_OTHER): Payer: Self-pay

## 2019-04-23 NOTE — Telephone Encounter (Signed)
Radiology Darl Pikes) left a voicemail informing the patient is schedule for CTA Neck W/WO contrast but in the chart the patient has allergy to iv contrast dye. Darl Pikes would like to see if the contrast protocol could be called in or change the order. The contrast dye protocol has been called into pharmacy and the patient has been made aware.

## 2019-04-24 ENCOUNTER — Other Ambulatory Visit: Payer: Self-pay | Admitting: Family Medicine

## 2019-04-24 DIAGNOSIS — R0602 Shortness of breath: Secondary | ICD-10-CM

## 2019-05-01 ENCOUNTER — Ambulatory Visit: Payer: Self-pay

## 2019-05-08 ENCOUNTER — Telehealth (INDEPENDENT_AMBULATORY_CARE_PROVIDER_SITE_OTHER): Payer: Self-pay

## 2019-05-08 ENCOUNTER — Ambulatory Visit
Admission: RE | Admit: 2019-05-08 | Discharge: 2019-05-08 | Disposition: A | Discharge status: Home / Self Care | Payer: Self-pay | Source: Ambulatory Visit | Attending: Family Medicine | Admitting: Family Medicine

## 2019-05-08 ENCOUNTER — Other Ambulatory Visit: Payer: Self-pay

## 2019-05-08 DIAGNOSIS — R0602 Shortness of breath: Secondary | ICD-10-CM | POA: Insufficient documentation

## 2019-05-08 LAB — NM MYOCAR MULTI W/SPECT W/WALL MOTION / EF
Estimated workload: 1 METS
Exercise duration (min): 0 min
Exercise duration (sec): 0 s
LV dias vol: 108 mL (ref 46–106)
LV sys vol: 48 mL
MPHR: 160 {beats}/min
Peak HR: 96 {beats}/min
Percent HR: 60 %
Rest HR: 75 {beats}/min
SDS: 5
SRS: 17
SSS: 18
TID: 1.02

## 2019-05-08 MED ORDER — TECHNETIUM TC 99M TETROFOSMIN IV KIT
10.7200 | PACK | Freq: Once | INTRAVENOUS | Status: AC | PRN
  Administered 2019-05-08: 10.72 via INTRAVENOUS

## 2019-05-08 MED ORDER — TECHNETIUM TC 99M TETROFOSMIN IV KIT
31.0800 | PACK | Freq: Once | INTRAVENOUS | Status: AC | PRN
  Administered 2019-05-08: 31.08 via INTRAVENOUS

## 2019-05-08 MED ORDER — REGADENOSON 0.4 MG/5ML IV SOLN
0.4000 mg | Freq: Once | INTRAVENOUS | Status: AC
  Administered 2019-05-08: 0.4 mg via INTRAVENOUS
  Filled 2019-05-08: qty 5

## 2019-05-08 NOTE — Telephone Encounter (Signed)
Pt called and wants to confirm the time for her 13 hour medication.

## 2019-05-08 NOTE — Telephone Encounter (Signed)
With a dye allergy protocol: the patient takes #3 50 mg tablets of Prednisone, one 13 hours before procedure, one 7 hours before procedure and one the day of procedure. Benadryl #2 25 mg tablets, one the day before procedure and one the day of procedure and one Pepcid 40 mg the day of procedure. Please explain to patient. Thank you.

## 2019-05-08 NOTE — Telephone Encounter (Signed)
Pls advise.  

## 2019-05-08 NOTE — Telephone Encounter (Signed)
I called and explained the above to the pt

## 2019-05-12 ENCOUNTER — Other Ambulatory Visit: Payer: Self-pay

## 2019-05-12 ENCOUNTER — Ambulatory Visit
Admission: RE | Admit: 2019-05-12 | Discharge: 2019-05-12 | Disposition: A | Discharge status: Home / Self Care | Payer: Self-pay | Source: Ambulatory Visit | Attending: Vascular Surgery | Admitting: Vascular Surgery

## 2019-05-12 DIAGNOSIS — I6523 Occlusion and stenosis of bilateral carotid arteries: Secondary | ICD-10-CM | POA: Insufficient documentation

## 2019-05-12 LAB — POCT I-STAT CREATININE: Creatinine, Ser: 1.4 mg/dL — ABNORMAL HIGH (ref 0.44–1.00)

## 2019-05-12 MED ORDER — IOHEXOL 350 MG/ML SOLN
75.0000 mL | Freq: Once | INTRAVENOUS | Status: AC | PRN
  Administered 2019-05-12: 75 mL via INTRAVENOUS

## 2019-05-15 ENCOUNTER — Other Ambulatory Visit: Payer: Self-pay

## 2019-05-15 ENCOUNTER — Ambulatory Visit (INDEPENDENT_AMBULATORY_CARE_PROVIDER_SITE_OTHER): Payer: Self-pay | Admitting: Vascular Surgery

## 2019-05-15 ENCOUNTER — Encounter (INDEPENDENT_AMBULATORY_CARE_PROVIDER_SITE_OTHER): Payer: Self-pay | Admitting: Vascular Surgery

## 2019-05-15 VITALS — BP 141/83 | HR 76 | Resp 16 | Wt 295.6 lb

## 2019-05-15 DIAGNOSIS — I6523 Occlusion and stenosis of bilateral carotid arteries: Secondary | ICD-10-CM

## 2019-05-15 DIAGNOSIS — E785 Hyperlipidemia, unspecified: Secondary | ICD-10-CM

## 2019-05-15 DIAGNOSIS — E1151 Type 2 diabetes mellitus with diabetic peripheral angiopathy without gangrene: Secondary | ICD-10-CM

## 2019-05-15 DIAGNOSIS — I10 Essential (primary) hypertension: Secondary | ICD-10-CM

## 2019-05-15 NOTE — Progress Notes (Signed)
MRN : [id number]  Alicia Fischer is a 59 y.o. (Dec 09, 1960) female who presents with chief complaint of  Chief Complaint  Patient presents with  . Follow-up    ct results  .  History of Present Illness: Patient returns today in follow up of her carotid disease.  She is doing well and has had no focal neurologic symptoms since her last visit last month.  She does have a previous history of stroke although she does not have obvious severe residual deficits. The patient has undergone a CT angiogram which I have independently reviewed.  This is not of the highest quality but partially in part because of her large size.  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  This was suggested on the previous duplex.  The right carotid artery stenosis is officially read as a 73% stenosis.  I would interpret it significantly worse than this and closer to an 80 to 90% stenosis, but either way it is a greater than 70% stenosis with a contralateral occlusion and a previous history of stroke.  Current Outpatient Medications  Medication Sig Dispense Refill  . acetaminophen (TYLENOL) 325 MG tablet Take 650 mg by mouth every 6 (six) hours as needed.    Marland Kitchen amLODipine (NORVASC) 10 MG tablet Take 10 mg by mouth daily.    Marland Kitchen aspirin 81 MG tablet Take 81 mg by mouth daily.      Marland Kitchen atorvastatin (LIPITOR) 80 MG tablet Take 80 mg by mouth daily.     . cetirizine (ZYRTEC) 10 MG tablet Take 10 mg by mouth daily.    Marland Kitchen gabapentin (NEURONTIN) 300 MG capsule Take by mouth.    . losartan (COZAAR) 100 MG tablet Take by mouth.    . meloxicam (MOBIC) 15 MG tablet Take by mouth.    . nitroGLYCERIN (NITROSTAT) 0.4 MG SL tablet Place under the tongue.    Marland Kitchen omeprazole (PRILOSEC) 20 MG capsule Take 20 mg by mouth daily.      . fluconazole (DIFLUCAN) 150 MG tablet Take 1 tablet (150 mg total) by mouth once a week. For 4 doses (Patient not taking: Reported on 04/14/2019) 4 tablet 0  . naproxen  (NAPROSYN) 500 MG tablet Take 1 tablet (500 mg total) by mouth 2 (two) times daily as needed for moderate pain. (Patient not taking: Reported on 10/15/2018) 30 tablet 0  . promethazine-dextromethorphan (PROMETHAZINE-DM) 6.25-15 MG/5ML syrup Take 5 mLs by mouth at bedtime as needed for cough. (Patient not taking: Reported on 10/15/2018) 60 mL 0   No current facility-administered medications for this visit.    Past Medical History:  Diagnosis Date  . Chest pain 6/10   probable costochondritits  . CHF (congestive heart failure) (HCC)   . Coronary artery disease   . Diabetes mellitus   . Dyslipidemia   . Dyspnea    Chronic, Echo 7/10: EF 55-60%, no valvular problems  . Enlarged heart   . GERD (gastroesophageal reflux disease)   . History of stroke   . Hyperlipidemia    on Lipitor  . Hypertension    on Lisinopril/ HCTZ  . Obesity   . OSA (obstructive sleep apnea)   . Stroke [facility]) 2002   without any residual deficit  . Type 2 diabetes mellitus (HCC)    on metformin    Past Surgical History:  Procedure Laterality Date  . BREAST BIOPSY Left 2014   neg  . CARDIAC CATHETERIZATION  2010  Social History   Tobacco Use  . Smoking status: Former Games developer  . Smokeless tobacco: Former Neurosurgeon    Quit date: 03/12/1998  Substance Use Topics  . Alcohol use: No  . Drug use: Never    Family History  Problem Relation Age of Onset  . Heart attack Father 20  . Breast cancer Mother 38  No bleeding or clotting disorders  Allergies  Allergen Reactions  . Beta Adrenergic Blockers   . Codeine   . Contrast Media  [Iodinated Diagnostic Agents]     Other reaction(s): Unknown   REVIEW OF SYSTEMS (Negative unless checked)  Constitutional: [] ?Weight loss  [] ?Fever  [] ?Chills Cardiac: [x] ?Chest pain   [] ?Chest pressure   [] ?Palpitations   [] ?Shortness of breath when laying flat   [] ?Shortness of breath at rest   [x] ?Shortness of breath with exertion. Vascular:  [] ?Pain in legs with walking    [] ?Pain in legs at rest   [] ?Pain in legs when laying flat   [] ?Claudication   [] ?Pain in feet when walking  [] ?Pain in feet at rest  [] ?Pain in feet when laying flat   [] ?History of DVT   [] ?Phlebitis   [] ?Swelling in legs   [] ?Varicose veins   [] ?Non-healing ulcers Pulmonary:   [] ?Uses home oxygen   [] ?Productive cough   [] ?Hemoptysis   [] ?Wheeze  [] ?COPD   [] ?Asthma Neurologic:  [] ?Dizziness  [] ?Blackouts   [] ?Seizures   [x] ?History of stroke   [] ?History of TIA  [] ?Aphasia   [] ?Temporary blindness   [] ?Dysphagia   [] ?Weakness or numbness in arms   [] ?Weakness or numbness in legs Musculoskeletal:  [x] ?Arthritis   [] ?Joint swelling   [x] ?Joint pain   [] ?Low back pain Hematologic:  [] ?Easy bruising  [] ?Easy bleeding   [] ?Hypercoagulable state   [] ?Anemic  [] ?Hepatitis Gastrointestinal:  [] ?Blood in stool   [] ?Vomiting blood  [x] ?Gastroesophageal reflux/heartburn   [] ?Abdominal pain Genitourinary:  [] ?Chronic kidney disease   [] ?Difficult urination  [] ?Frequent urination  [] ?Burning with urination   [] ?Hematuria Skin:  [] ?Rashes   [] ?Ulcers   [] ?Wounds Psychological:  [] ?History of anxiety   [] ? History of major depression.   Physical Examination  BP (!) 141/83 (BP Location: Right Arm)   Pulse 76   Resp 16   Wt 295 lb 9.6 oz (134.1 kg)   BMI 46.30 kg/m  Gen:  WD/WN, NAD.  Obese Head: [place]/AT, No temporalis wasting. Ear/Nose/Throat: Hearing grossly intact, nares w/o erythema or drainage Eyes: Conjunctiva clear. Sclera non-icteric Neck: Supple.  Trachea midline.  Bilateral carotid bruits Pulmonary:  Good air movement, no use of accessory muscles.  Cardiac: RRR, no JVD Vascular:  Vessel Right Left  Radial Palpable Palpable                       Musculoskeletal: M/S 5/5 throughout.  No deformity or atrophy.  Mild bilateral lower extremity edema. Neurologic: Sensation grossly intact in extremities.  Symmetrical.  Speech is fluent.  Psychiatric: Judgment intact, Mood & affect appropriate  for pt's clinical situation. Dermatologic: No rashes or ulcers noted.  No cellulitis or open wounds.       Labs Recent Results (from the past 2160 hour(s))  NM Myocar Multi W/Spect W/Wall Motion / EF     Status: None   Collection Time: 05/08/19 11:33 AM  Result Value Ref Range   Rest HR 75 bpm   Rest BP 141/77 mmHg   Exercise duration (sec) 0 sec   Percent HR 60 %   Exercise duration (min)  0 min   Estimated workload 1.0 METS   Peak HR 96 bpm   Peak BP 140/62 mmHg   MPHR 160 bpm   SSS 18    SRS 17    SDS 5    TID 1.02    LV sys vol 48 mL   LV dias vol 108 46 - 106 mL  I-STAT creatinine     Status: Abnormal   Collection Time: 05/12/19 10:09 AM  Result Value Ref Range   Creatinine, Ser 1.40 (H) 0.44 - 1.00 mg/dL    Radiology CT Angio Neck W/Cm &/Or Wo/Cm  Result Date: 05/12/2019 CLINICAL DATA:  Bilateral carotid artery stenosis. Additional history provided: Left-sided neck pain. EXAM: CT ANGIOGRAPHY NECK TECHNIQUE: Multidetector CT imaging of the neck was performed using the standard protocol during bolus administration of intravenous contrast. Multiplanar CT image reconstructions and MIPs were obtained to evaluate the vascular anatomy. Carotid stenosis measurements (when applicable) are obtained utilizing NASCET criteria, using the distal internal carotid diameter as the denominator. CONTRAST:  27mL OMNIPAQUE IOHEXOL 350 MG/ML SOLN COMPARISON:  Carotid artery duplex ultrasound 04/07/2019 FINDINGS: Aortic arch: Common origin of the innominate and left common carotid arteries. Calcified plaque within the visualized aortic arch and proximal major branch vessels of the neck. No appreciable significant innominate or proximal subclavian artery stenosis, although streak artifact from a dense contrast bolus limits evaluation on the right. Right carotid system: The CCA is patent without stenosis to the bifurcation. There is prominent mixed plaque within the carotid bifurcation and proximal  ICA. Estimated stenosis of the proximal ICA of 73%. Left carotid system: The CCA is patent without stenosis to the bifurcation. Small amount of calcified plaque within the carotid bifurcation. There is occlusion or near occlusion of the cervical ICA from its origin. There is minimal central enhancement within the visualized portions of the pre cavernous and cavernous ICA. Vertebral arteries: Patent. The left vertebral artery is dominant. Calcified plaque at the origin of the right vertebral artery with mild ostial stenosis. Mild calcified plaque is also present within the V3 right vertebral artery without stenosis at this site. Calcified plaque at the origin of the left vertebral artery with mild/moderate ostial stenosis. Skeleton: No acute bony abnormality or aggressive osseous lesion. Cervical spondylosis without high-grade bony spinal canal stenosis. Other neck: No neck mass or cervical lymphadenopathy Upper chest: No consolidation within the imaged lung apices. IMPRESSION: The cervical left ICA is occluded or near occluded from its origin. Minimal enhancement is seen within the visualized precavernous and cavernous segments. Prominent mixed plaque within the right carotid bifurcation and proximal ICA. Estimated 73% stenosis of the proximal right ICA. The vertebral arteries are patent within the neck bilaterally with the left dominant. Bilateral ostial stenoses (mild right, mild/moderate left). Electronically Signed   By: Jackey Loge DO   On: 05/12/2019 13:20   NM Myocar Multi W/Spect W/Wall Motion / EF  Result Date: 05/08/2019 Pharmacological myocardial perfusion imaging study with no significant  ischemia Normal wall motion, EF estimated at 46% (possibly depressed secondary to GI uptake artifact) No EKG changes concerning for ischemia at peak stress or in recovery. Low risk scan Signed, Dossie Arbour, MD, Ph.D [facility]    Assessment/Plan HYPERTENSION, BENIGN blood pressure control important in  reducing the progression of atherosclerotic disease. On appropriate oral medications.   Diabetes (HCC) blood glucose control important in reducing the progression of atherosclerotic disease. Also, involved in wound healing. On appropriate medications.   Hyperlipidemia lipid control important in  reducing the progression of atherosclerotic disease. Continue statin therapy  Carotid stenosis The patient has undergone a CT angiogram which I have independently reviewed.  This is not of the highest quality but partially in part because of her large size.  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  This was suggested on the previous duplex.  The right carotid artery stenosis is officially read as a 73% stenosis.  I would interpret it significantly worse than this and closer to an 80 to 90% stenosis, but either way it is a greater than 70% stenosis with a contralateral occlusion and a previous history of stroke.  As such, intervention is clearly warranted to reduce her stroke risk.  Her anatomy does appear to be adequate for carotid stenting and with a contralateral occlusion that would be our preference.  I have started her on Plavix and given her a prescription today.  I discussed the risk and benefits of carotid artery stenting in detail with the patient today and she voices her understanding and is agreeable to proceed.  Right carotid stent will be performed in the near future.    Leotis Pain, MD  05/15/2019 12:13 PM    This note was created with Dragon medical transcription system.  Any errors from dictation are purely unintentional

## 2019-05-15 NOTE — Assessment & Plan Note (Addendum)
The patient has undergone a CT angiogram which I have independently reviewed.  This is not of the highest quality but partially in part because of her large size.  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  This was suggested on the previous duplex.  The right carotid artery stenosis is officially read as a 73% stenosis.  I would interpret it significantly worse than this and closer to an 80 to 90% stenosis, but either way it is a greater than 70% stenosis with a contralateral occlusion and a previous history of stroke.  As such, intervention is clearly warranted to reduce her stroke risk.  Her anatomy does appear to be adequate for carotid stenting and with a contralateral occlusion that would be our preference.  I have started her on Plavix and given her a prescription today.  I discussed the risk and benefits of carotid artery stenting in detail with the patient today and she voices her understanding and is agreeable to proceed.  Right carotid stent will be performed in the near future.

## 2019-05-15 NOTE — Patient Instructions (Signed)
Carotid Angioplasty With Stent Carotid angioplasty with stent is a procedure to open or widen an artery in the neck (carotid artery) that has become narrowed. This is done by inflating a small balloon inside the artery and then placing a small piece of metal that looks like a coil or spring (stent) inside the artery. The stent helps keep the artery open by supporting the artery walls. The carotid arteries supply blood to the brain. When fats, cholesterol, and other materials (plaque) build up in an artery, the artery becomes narrow and can become blocked. This can reduce or block blood flow to certain areas of the brain, which can cause serious health problems, including stroke. Tell a health care provider about:  Any allergies you have.  All medicines you are taking, including vitamins, herbs, eye drops, creams, and over-the-counter medicines.  Any problems you or family members have had with anesthetic medicines.  Any blood disorders you have.  Any surgeries you have had.  Any medical conditions you have.  Whether you are pregnant or may be pregnant. What are the risks? Generally, this is a safe procedure. However, problems may occur, including:  Infection.  Bleeding.  Allergic reactions to medicines or dyes.  Damage to other structures or organs, or to the carotid artery itself.  The carotid artery becoming blocked again.  A collection of blood under the skin (hematoma) around the stent site that gets larger.  A blood clot in another part of the body.  Kidney injury.  Stroke.  Heart attack. What happens before the procedure?  Ask your health care provider about: ? Changing or stopping your regular medicines. This is especially important if you are taking diabetes medicines or blood thinners. ? Whether aspirin is recommended before this procedure. ? Taking over-the-counter medicines, vitamins, herbs, and supplements.  Follow instructions from your health care provider  about eating or drinking restrictions.  Do not use any products that contain nicotine or tobacco for 4 weeks before the procedure. These products include cigarettes, e-cigarettes, and chewing tobacco. If you need help quitting, ask your health care provider.  Ask your health care provider what steps will be taken to help prevent infection. These may include: ? Removing hair at the surgery site. ? Washing skin with a germ-killing soap. ? Taking antibiotic medicine.  You may have blood tests and imaging tests done.  Plan to have someone take you home from the hospital or clinic.  If you will be going home right after the procedure, plan to have someone with you for 24 hours. What happens during the procedure?   An IV will be inserted into one of your veins.  You may be given one or more of the following: ? A medicine to help you relax (sedative). ? A medicine to numb the area where the catheter will be inserted (local anesthetic).  Most commonly, an incision will be made in your groin. In some cases, an incision may be made in your wrist or forearm instead of your groin.  A small, thin tube (catheter) will be inserted through your incision, into an artery. The catheter will be threaded upward into your carotid artery. An X-ray machine (fluoroscope) will help your health care provider guide the catheter to the correct place in your artery.  Dye will be injected into the catheter and will travel to the narrow or blocked part of your carotid artery.  X-ray images will be taken of how the dye flows through your artery. While the images   are being taken, you may be given instructions about breathing, swallowing, moving, or talking.  A filter (distal protection device) will be inserted into your artery. This will be used to catch plaque that comes loose in your artery during the procedure. This reduces the risk of plaque moving into your brain.  A small balloon will be inserted into your  artery. The balloon will be inflated for a few seconds to widen your artery and will then be removed.  The stent will be placed in your artery.  A second small balloon will be inserted into your artery and inflated. This expands the stent inside of your artery so that the stent holds up the artery walls. The balloon will then be removed.  The catheter and the distal protection device will be removed from your artery.  Your incision may be closed with stitches (sutures), skin glue, or adhesive tape.  A bandage (dressing) will be placed over your incision. The procedure may vary among health care providers and hospitals. What happens after the procedure?  Your blood pressure, heart rate, breathing rate, and blood oxygen level will be monitored until you leave the hospital or clinic.  You may continue to receive fluids and medicines through an IV.  You may need to have pressure placed on the incision site to prevent bleeding.  You will need to keep the area still for a few hours, or as long as directed by your health care provider. If the procedure was done in the groin, you will be instructed not to bend or cross your legs.  You may have some pain. Pain medicines will be available to help you.  You may have a test that uses sound waves to take pictures (ultrasound) of the carotid artery. This can be compared to future tests to check for changes in the artery.  Do not drive for 24 hours. Summary  Carotid angioplasty with stent is a procedure to open or widen an artery in the neck (carotid artery) that has become narrowed.  The procedure is done to lower the risk of problems that can result from reduced blood flow to the brain, including a stroke.  The stent placed inside the artery will help keep the artery open by supporting the artery walls.  Follow instructions from your health care provider about taking medicines and about eating and drinking before the procedure. This  information is not intended to replace advice given to you by your health care provider. Make sure you discuss any questions you have with your health care provider. Document Revised: 12/24/2017 Document Reviewed: 12/19/2017 Elsevier Patient Education  2020 [employer]  

## 2019-05-18 ENCOUNTER — Telehealth (INDEPENDENT_AMBULATORY_CARE_PROVIDER_SITE_OTHER): Payer: Self-pay

## 2019-05-18 NOTE — Telephone Encounter (Signed)
Spoke with the patient and she is now scheduled with Dr. Wyn Quaker for a right carotid stent placement on 05/25/19 with a 8:15 am arrival to the [facility]. Patient will do a phone pre-op on 05/20/19 between 1-5 pm and covid testing on 05/21/19 between 12:30-2:30 pm at the [facility]. Pre-procedure instructions were discussed and will be mailed to the patient.

## 2019-05-20 ENCOUNTER — Other Ambulatory Visit: Payer: Self-pay

## 2019-05-20 ENCOUNTER — Other Ambulatory Visit (INDEPENDENT_AMBULATORY_CARE_PROVIDER_SITE_OTHER): Payer: Self-pay | Admitting: Nurse Practitioner

## 2019-05-20 ENCOUNTER — Encounter
Admission: RE | Admit: 2019-05-20 | Discharge: 2019-05-20 | Disposition: A | Discharge status: Home / Self Care | Payer: Disability Insurance | Source: Ambulatory Visit | Attending: Vascular Surgery | Admitting: Vascular Surgery

## 2019-05-20 NOTE — Pre-Procedure Instructions (Signed)
Copy and pasted from care everywhere:  2021 January ECG 12-lead1/14/2021 [facility] System Component Name Value Ref Range  Vent Rate (bpm) 80   PR Interval (msec) 178   QRS Interval (msec) 84   QT Interval (msec) 404   QTc (msec) 465   Other Result Information  This result has an attachment that is not available.  Result Narrative  Normal sinus rhythm   When compared with ECG of 09-Apr-2018 15:56, No significant change was found I reviewed and concur with this report. Electronically signed [id number] MD, KEN ([id number]) on 04/01/2019 8:41:19 AM  Status Results Details   Encounter Summary

## 2019-05-20 NOTE — Patient Instructions (Addendum)
FOLLOW INSTRUCTIONS GIVEN TO YOU BY [facility].  Your procedure is scheduled with Dr. Wyn Quaker                            On:  Monday, May 25, 2019    Check in at the "Registration Desk" and then you will be directed to the '[facility]' unit.  Do not eat or drink 8 hours prior to your procedure.    Please call Dr Driscilla Grammes office with any questions or concerns: [phone].  You will need to have someone drive you home and stay with you the night of the procedure.

## 2019-05-21 ENCOUNTER — Other Ambulatory Visit
Admission: RE | Admit: 2019-05-21 | Discharge: 2019-05-21 | Disposition: A | Discharge status: Home / Self Care | Payer: HRSA Program | Source: Ambulatory Visit | Attending: Vascular Surgery | Admitting: Vascular Surgery

## 2019-05-21 DIAGNOSIS — Z20822 Contact with and (suspected) exposure to covid-19: Secondary | ICD-10-CM | POA: Diagnosis not present

## 2019-05-21 DIAGNOSIS — Z01812 Encounter for preprocedural laboratory examination: Secondary | ICD-10-CM | POA: Insufficient documentation

## 2019-05-21 LAB — SARS CORONAVIRUS 2 (TAT 6-24 HRS): SARS Coronavirus 2: NEGATIVE

## 2019-05-21 NOTE — Telephone Encounter (Signed)
Patient left a message asking about continuing Plavix for her upcoming carotid stent procedure with Dr. Wyn Quaker on 05/25/19. I advised per the OP instructions sheet that the patient can continue taking her Plavix.

## 2019-05-25 ENCOUNTER — Encounter: Payer: Self-pay | Admitting: Vascular Surgery

## 2019-05-25 ENCOUNTER — Other Ambulatory Visit: Payer: Self-pay

## 2019-05-25 ENCOUNTER — Encounter
Admission: RE | Disposition: A | Discharge status: Home / Self Care | Payer: Self-pay | Source: Ambulatory Visit | Attending: Vascular Surgery

## 2019-05-25 ENCOUNTER — Observation Stay
Admission: RE | Admit: 2019-05-25 | Discharge: 2019-05-26 | Disposition: A | Discharge status: Home / Self Care | Payer: Disability Insurance | Source: Ambulatory Visit | Attending: Vascular Surgery | Admitting: Vascular Surgery

## 2019-05-25 DIAGNOSIS — E785 Hyperlipidemia, unspecified: Secondary | ICD-10-CM | POA: Insufficient documentation

## 2019-05-25 DIAGNOSIS — K219 Gastro-esophageal reflux disease without esophagitis: Secondary | ICD-10-CM | POA: Insufficient documentation

## 2019-05-25 DIAGNOSIS — Z791 Long term (current) use of non-steroidal anti-inflammatories (NSAID): Secondary | ICD-10-CM | POA: Insufficient documentation

## 2019-05-25 DIAGNOSIS — I2510 Atherosclerotic heart disease of native coronary artery without angina pectoris: Secondary | ICD-10-CM | POA: Insufficient documentation

## 2019-05-25 DIAGNOSIS — Z79899 Other long term (current) drug therapy: Secondary | ICD-10-CM | POA: Insufficient documentation

## 2019-05-25 DIAGNOSIS — I509 Heart failure, unspecified: Secondary | ICD-10-CM | POA: Insufficient documentation

## 2019-05-25 DIAGNOSIS — Z8673 Personal history of transient ischemic attack (TIA), and cerebral infarction without residual deficits: Secondary | ICD-10-CM | POA: Insufficient documentation

## 2019-05-25 DIAGNOSIS — I6523 Occlusion and stenosis of bilateral carotid arteries: Principal | ICD-10-CM | POA: Insufficient documentation

## 2019-05-25 DIAGNOSIS — Z7982 Long term (current) use of aspirin: Secondary | ICD-10-CM | POA: Insufficient documentation

## 2019-05-25 DIAGNOSIS — Z7902 Long term (current) use of antithrombotics/antiplatelets: Secondary | ICD-10-CM | POA: Insufficient documentation

## 2019-05-25 DIAGNOSIS — I6521 Occlusion and stenosis of right carotid artery: Secondary | ICD-10-CM

## 2019-05-25 DIAGNOSIS — E119 Type 2 diabetes mellitus without complications: Secondary | ICD-10-CM | POA: Insufficient documentation

## 2019-05-25 DIAGNOSIS — Z6841 Body Mass Index (BMI) 40.0 and over, adult: Secondary | ICD-10-CM | POA: Insufficient documentation

## 2019-05-25 DIAGNOSIS — I6529 Occlusion and stenosis of unspecified carotid artery: Secondary | ICD-10-CM | POA: Diagnosis present

## 2019-05-25 DIAGNOSIS — G4733 Obstructive sleep apnea (adult) (pediatric): Secondary | ICD-10-CM | POA: Insufficient documentation

## 2019-05-25 DIAGNOSIS — I110 Hypertensive heart disease with heart failure: Secondary | ICD-10-CM | POA: Insufficient documentation

## 2019-05-25 DIAGNOSIS — E669 Obesity, unspecified: Secondary | ICD-10-CM | POA: Insufficient documentation

## 2019-05-25 HISTORY — PX: CAROTID PTA/STENT INTERVENTION: CATH118231

## 2019-05-25 LAB — CREATININE, SERUM
Creatinine, Ser: 1.64 mg/dL — ABNORMAL HIGH (ref 0.44–1.00)
GFR calc Af Amer: 39 mL/min — ABNORMAL LOW (ref >60)
GFR calc non Af Amer: 34 mL/min — ABNORMAL LOW (ref >60)

## 2019-05-25 LAB — GLUCOSE, CAPILLARY
Glucose-Capillary: 105 mg/dL — ABNORMAL HIGH (ref 70–99)
Glucose-Capillary: 133 mg/dL — ABNORMAL HIGH (ref 70–99)
Glucose-Capillary: 162 mg/dL — ABNORMAL HIGH (ref 70–99)
Glucose-Capillary: 187 mg/dL — ABNORMAL HIGH (ref 70–99)

## 2019-05-25 LAB — POCT ACTIVATED CLOTTING TIME: Activated Clotting Time: 268 seconds

## 2019-05-25 LAB — MRSA PCR SCREENING: MRSA by PCR: NEGATIVE

## 2019-05-25 LAB — BUN: BUN: 24 mg/dL — ABNORMAL HIGH (ref 6–20)

## 2019-05-25 SURGERY — CAROTID PTA/STENT INTERVENTION
Anesthesia: Moderate Sedation | Laterality: Right

## 2019-05-25 MED ORDER — ATROPINE SULFATE 1 MG/10ML IJ SOSY
PREFILLED_SYRINGE | INTRAMUSCULAR | Status: AC
  Filled 2019-05-25: qty 10

## 2019-05-25 MED ORDER — NITROGLYCERIN 0.4 MG SL SUBL: 0.4000 mg | SUBLINGUAL_TABLET | SUBLINGUAL | Status: DC | PRN

## 2019-05-25 MED ORDER — MIDAZOLAM HCL 5 MG/5ML IJ SOLN
INTRAMUSCULAR | Status: AC
  Filled 2019-05-25: qty 5

## 2019-05-25 MED ORDER — GUAIFENESIN-DM 100-10 MG/5ML PO SYRP
15.0000 mL | ORAL_SOLUTION | ORAL | Status: DC | PRN
  Filled 2019-05-25: qty 15

## 2019-05-25 MED ORDER — SODIUM CHLORIDE 0.9 % IV SOLN: INTRAVENOUS | Status: DC | PRN

## 2019-05-25 MED ORDER — METHYLPREDNISOLONE SODIUM SUCC 125 MG IJ SOLR
INTRAMUSCULAR | Status: AC
  Administered 2019-05-25: 125 mg via INTRAVENOUS
  Filled 2019-05-25: qty 2

## 2019-05-25 MED ORDER — FAMOTIDINE 20 MG PO TABS
ORAL_TABLET | ORAL | Status: AC
  Administered 2019-05-25: 20 mg via ORAL
  Filled 2019-05-25: qty 2

## 2019-05-25 MED ORDER — TRAMADOL HCL 50 MG PO TABS: 50.0000 mg | ORAL_TABLET | Freq: Four times a day (QID) | ORAL | Status: DC | PRN

## 2019-05-25 MED ORDER — SODIUM CHLORIDE 0.9 % IV BOLUS
INTRAVENOUS | Status: DC | PRN
  Administered 2019-05-25: 250 mL via INTRAVENOUS

## 2019-05-25 MED ORDER — MIDAZOLAM HCL 2 MG/ML PO SYRP: 8.0000 mg | ORAL_SOLUTION | Freq: Once | ORAL | Status: DC | PRN

## 2019-05-25 MED ORDER — SODIUM CHLORIDE FLUSH 0.9 % IV SOLN
INTRAVENOUS | Status: AC
  Filled 2019-05-25: qty 10

## 2019-05-25 MED ORDER — ATROPINE SULFATE 1 MG/10ML IJ SOSY
PREFILLED_SYRINGE | INTRAMUSCULAR | Status: AC
  Filled 2019-05-25: qty 20

## 2019-05-25 MED ORDER — CEFAZOLIN SODIUM-DEXTROSE 2-4 GM/100ML-% IV SOLN: 2.0000 g | Freq: Once | INTRAVENOUS | Status: AC

## 2019-05-25 MED ORDER — HYDROMORPHONE HCL 1 MG/ML IJ SOLN: 1.0000 mg | Freq: Once | INTRAMUSCULAR | Status: DC | PRN

## 2019-05-25 MED ORDER — HEPARIN SODIUM (PORCINE) 1000 UNIT/ML IJ SOLN
INTRAMUSCULAR | Status: AC
  Filled 2019-05-25: qty 2

## 2019-05-25 MED ORDER — FAMOTIDINE 20 MG PO TABS
20.0000 mg | ORAL_TABLET | Freq: Every day | ORAL | Status: DC
  Administered 2019-05-25 – 2019-05-26 (×2): 20 mg via ORAL
  Filled 2019-05-25 (×2): qty 1

## 2019-05-25 MED ORDER — PHENYLEPHRINE HCL (PRESSORS) 10 MG/ML IV SOLN
INTRAVENOUS | Status: AC
  Filled 2019-05-25: qty 1

## 2019-05-25 MED ORDER — CLOPIDOGREL BISULFATE 75 MG PO TABS
75.0000 mg | ORAL_TABLET | Freq: Every day | ORAL | Status: DC
  Administered 2019-05-26: 75 mg via ORAL
  Filled 2019-05-25: qty 1

## 2019-05-25 MED ORDER — CLOPIDOGREL BISULFATE 75 MG PO TABS: 75.0000 mg | ORAL_TABLET | Freq: Every day | ORAL | Status: DC

## 2019-05-25 MED ORDER — INSULIN ASPART 100 UNIT/ML ~~LOC~~ SOLN
0.0000 [IU] | Freq: Three times a day (TID) | SUBCUTANEOUS | Status: DC
  Administered 2019-05-25: 3 [IU] via SUBCUTANEOUS
  Administered 2019-05-26: 2 [IU] via SUBCUTANEOUS
  Filled 2019-05-25 (×2): qty 1

## 2019-05-25 MED ORDER — GABAPENTIN 300 MG PO CAPS
600.0000 mg | ORAL_CAPSULE | Freq: Every day | ORAL | Status: DC
  Administered 2019-05-25: 600 mg via ORAL
  Filled 2019-05-25: qty 2

## 2019-05-25 MED ORDER — ATORVASTATIN CALCIUM 20 MG PO TABS: 20.0000 mg | ORAL_TABLET | Freq: Every day | ORAL | Status: DC

## 2019-05-25 MED ORDER — HEPARIN SODIUM (PORCINE) 1000 UNIT/ML IJ SOLN
INTRAMUSCULAR | Status: DC | PRN
  Administered 2019-05-25: 2000 [IU] via INTRAVENOUS
  Administered 2019-05-25: 8000 [IU] via INTRAVENOUS

## 2019-05-25 MED ORDER — MIDAZOLAM HCL 2 MG/2ML IJ SOLN
INTRAMUSCULAR | Status: DC | PRN
  Administered 2019-05-25: 2 mg via INTRAVENOUS

## 2019-05-25 MED ORDER — ATROPINE SULFATE 1 MG/10ML IJ SOSY
PREFILLED_SYRINGE | INTRAMUSCULAR | Status: DC | PRN
  Administered 2019-05-25: 1 mg via INTRAVENOUS

## 2019-05-25 MED ORDER — PANTOPRAZOLE SODIUM 40 MG PO TBEC: 40.0000 mg | DELAYED_RELEASE_TABLET | Freq: Every day | ORAL | Status: DC

## 2019-05-25 MED ORDER — SODIUM CHLORIDE 0.9 % IV SOLN: INTRAVENOUS | Status: DC

## 2019-05-25 MED ORDER — ASPIRIN EC 325 MG PO TBEC
325.0000 mg | DELAYED_RELEASE_TABLET | Freq: Every day | ORAL | Status: DC
  Administered 2019-05-25: 325 mg via ORAL
  Filled 2019-05-25: qty 1

## 2019-05-25 MED ORDER — CEFAZOLIN SODIUM-DEXTROSE 1-4 GM/50ML-% IV SOLN: 1.0000 g | Freq: Once | INTRAVENOUS | Status: DC

## 2019-05-25 MED ORDER — PHENOL 1.4 % MT LIQD
1.0000 | OROMUCOSAL | Status: DC | PRN
  Filled 2019-05-25: qty 177

## 2019-05-25 MED ORDER — FAMOTIDINE 20 MG PO TABS: 40.0000 mg | ORAL_TABLET | Freq: Once | ORAL | Status: DC | PRN

## 2019-05-25 MED ORDER — ASPIRIN 81 MG PO TABS: 81.0000 mg | ORAL_TABLET | Freq: Every day | ORAL | Status: DC

## 2019-05-25 MED ORDER — ACETAMINOPHEN 325 MG PO TABS: 325.0000 mg | ORAL_TABLET | ORAL | Status: DC | PRN

## 2019-05-25 MED ORDER — AMLODIPINE BESYLATE 5 MG PO TABS
5.0000 mg | ORAL_TABLET | Freq: Every day | ORAL | Status: DC
  Administered 2019-05-26: 5 mg via ORAL
  Filled 2019-05-25: qty 1

## 2019-05-25 MED ORDER — IODIXANOL 320 MG/ML IV SOLN
INTRAVENOUS | Status: DC | PRN
  Administered 2019-05-25: 12:00:00 40 mL via INTRA_ARTERIAL

## 2019-05-25 MED ORDER — CHLORHEXIDINE GLUCONATE CLOTH 2 % EX PADS: 6.0000 | MEDICATED_PAD | Freq: Every day | CUTANEOUS | Status: DC

## 2019-05-25 MED ORDER — NITROGLYCERIN IN D5W 200-5 MCG/ML-% IV SOLN
INTRAVENOUS | Status: AC
  Filled 2019-05-25: qty 250

## 2019-05-25 MED ORDER — FENTANYL CITRATE (PF) 100 MCG/2ML IJ SOLN
INTRAMUSCULAR | Status: DC | PRN
  Administered 2019-05-25: 50 ug via INTRAVENOUS

## 2019-05-25 MED ORDER — DIPHENHYDRAMINE HCL 50 MG/ML IJ SOLN
INTRAMUSCULAR | Status: AC
  Administered 2019-05-25: 50 mg via INTRAVENOUS
  Filled 2019-05-25: qty 1

## 2019-05-25 MED ORDER — ACETAMINOPHEN 325 MG RE SUPP
325.0000 mg | RECTAL | Status: DC | PRN
  Filled 2019-05-25: qty 2

## 2019-05-25 MED ORDER — DIPHENHYDRAMINE HCL 50 MG/ML IJ SOLN: 50.0000 mg | Freq: Once | INTRAMUSCULAR | Status: AC | PRN

## 2019-05-25 MED ORDER — ONDANSETRON HCL 4 MG/2ML IJ SOLN: 4.0000 mg | Freq: Four times a day (QID) | INTRAMUSCULAR | Status: DC | PRN

## 2019-05-25 MED ORDER — CEFAZOLIN SODIUM-DEXTROSE 2-4 GM/100ML-% IV SOLN
INTRAVENOUS | Status: AC
  Administered 2019-05-25: 2 g via INTRAVENOUS
  Filled 2019-05-25: qty 100

## 2019-05-25 MED ORDER — CEFAZOLIN SODIUM-DEXTROSE 2-4 GM/100ML-% IV SOLN
2.0000 g | Freq: Three times a day (TID) | INTRAVENOUS | Status: AC
  Administered 2019-05-25 – 2019-05-26 (×2): 2 g via INTRAVENOUS
  Filled 2019-05-25 (×2): qty 100

## 2019-05-25 MED ORDER — FENTANYL CITRATE (PF) 100 MCG/2ML IJ SOLN
INTRAMUSCULAR | Status: AC
  Filled 2019-05-25: qty 2

## 2019-05-25 MED ORDER — HYDRALAZINE HCL 20 MG/ML IJ SOLN: 5.0000 mg | INTRAMUSCULAR | Status: DC | PRN

## 2019-05-25 MED ORDER — SODIUM CHLORIDE 0.9 % IV SOLN: INTRAVENOUS | Status: AC

## 2019-05-25 MED ORDER — HEPARIN SODIUM (PORCINE) 5000 UNIT/ML IJ SOLN
5000.0000 [IU] | Freq: Three times a day (TID) | INTRAMUSCULAR | Status: DC
  Administered 2019-05-25 – 2019-05-26 (×4): 5000 [IU] via SUBCUTANEOUS
  Filled 2019-05-25 (×4): qty 1

## 2019-05-25 MED ORDER — ALUM & MAG HYDROXIDE-SIMETH 200-200-20 MG/5ML PO SUSP: 15.0000 mL | ORAL | Status: DC | PRN

## 2019-05-25 MED ORDER — HEPARIN SODIUM (PORCINE) 1000 UNIT/ML IJ SOLN
INTRAMUSCULAR | Status: AC
  Filled 2019-05-25: qty 1

## 2019-05-25 MED ORDER — GABAPENTIN 300 MG PO CAPS
300.0000 mg | ORAL_CAPSULE | Freq: Every day | ORAL | Status: DC
  Administered 2019-05-26: 300 mg via ORAL
  Filled 2019-05-25: qty 1

## 2019-05-25 MED ORDER — METHYLPREDNISOLONE SODIUM SUCC 125 MG IJ SOLR: 125.0000 mg | Freq: Once | INTRAMUSCULAR | Status: AC | PRN

## 2019-05-25 MED ORDER — SODIUM CHLORIDE 0.9 % IV SOLN: 500.0000 mL | Freq: Once | INTRAVENOUS | Status: DC | PRN

## 2019-05-25 MED ORDER — DOPAMINE-DEXTROSE 3.2-5 MG/ML-% IV SOLN
INTRAVENOUS | Status: AC
  Filled 2019-05-25: qty 250

## 2019-05-25 MED ORDER — ATORVASTATIN CALCIUM 20 MG PO TABS
80.0000 mg | ORAL_TABLET | Freq: Every day | ORAL | Status: DC
  Administered 2019-05-25 – 2019-05-26 (×2): 80 mg via ORAL
  Filled 2019-05-25 (×2): qty 4
  Filled 2019-05-25: qty 1

## 2019-05-25 MED ORDER — ASPIRIN EC 81 MG PO TBEC
81.0000 mg | DELAYED_RELEASE_TABLET | Freq: Every day | ORAL | Status: DC
  Administered 2019-05-26: 81 mg via ORAL
  Filled 2019-05-25: qty 1

## 2019-05-25 MED ORDER — LOSARTAN POTASSIUM 50 MG PO TABS
100.0000 mg | ORAL_TABLET | Freq: Every day | ORAL | Status: DC
  Filled 2019-05-25: qty 2

## 2019-05-25 MED ORDER — GABAPENTIN 300 MG PO CAPS: 300.0000 mg | ORAL_CAPSULE | Freq: Two times a day (BID) | ORAL | Status: DC

## 2019-05-25 SURGICAL SUPPLY — 18 items
BALLN VIATRAC 5X30X135 (BALLOONS) ×3
BALLOON VIATRAC 5X30X135 (BALLOONS) ×1 IMPLANT
CATH ANGIO 5F 100CM .035 PIG (CATHETERS) ×3 IMPLANT
CATH BEACON 5 .035 100 H1 TIP (CATHETERS) ×3 IMPLANT
DEVICE EMBOSHIELD NAV6 4.0-7.0 (FILTER) ×3 IMPLANT
DEVICE PRESTO INFLATION (MISCELLANEOUS) ×3 IMPLANT
DEVICE STARCLOSE SE CLOSURE (Vascular Products) ×3 IMPLANT
DEVICE TORQUE .025-.038 (MISCELLANEOUS) ×3 IMPLANT
GLIDEWIRE ANGLED SS 035X260CM (WIRE) ×3 IMPLANT
KIT CAROTID MANIFOLD (MISCELLANEOUS) ×3 IMPLANT
PACK ANGIOGRAPHY (CUSTOM PROCEDURE TRAY) ×3 IMPLANT
SHEATH BRITE TIP 5FRX11 (SHEATH) ×3 IMPLANT
SHEATH SHUTTLE SELECT 6F (SHEATH) ×3 IMPLANT
STENT XACT CAR 9-7X40X136 (Permanent Stent) ×3 IMPLANT
SYR MEDRAD MARK 7 150ML (SYRINGE) ×3 IMPLANT
TUBING CONTRAST HIGH PRESS 72 (TUBING) ×3 IMPLANT
WIRE G VAS 035X260 STIFF (WIRE) ×3 IMPLANT
WIRE J 3MM .035X145CM (WIRE) ×3 IMPLANT

## 2019-05-25 NOTE — H&P (Signed)
[facility] History & Physical Update  The patient was interviewed and re-examined.  The patient's previous History and Physical has been reviewed and is unchanged.  There is no change in the plan of care. We plan to proceed with the scheduled procedure.  Festus Barren, MD  05/25/2019, 8:09 AM

## 2019-05-25 NOTE — Op Note (Signed)
OPERATIVE NOTE DATE: 05/25/2019  PROCEDURE: 1.  Ultrasound guidance for vascular access right femoral artery 2.  Placement of a 9 mm proximal, 7 mm distal, 4 cm long exact stent with the use of the NAV-6 embolic protection device in the right carotid artery  PRE-OPERATIVE DIAGNOSIS: 1.  High-grade right carotid artery stenosis. 2.  Left carotid artery occlusion 3.  Previous history of stroke  POST-OPERATIVE DIAGNOSIS:  Same as above  SURGEON: Leotis Pain, MD  ASSISTANT(S): None  ANESTHESIA: local/MCS  ESTIMATED BLOOD LOSS: 25 cc  CONTRAST: 40 cc  FLUORO TIME: 5.6 minutes  MODERATE CONSCIOUS SEDATION TIME:  Approximately 30 minutes using 2 mg of Versed and 50 mcg of Fentanyl  FINDING(S): 1.   85 to 90% right internal carotid artery stenosis  SPECIMEN(S):   none  INDICATIONS:   Patient is a 59 y.o. female who presents with greater than 80% right carotid artery stenosis.  The patient has a previous history of stroke and occlusion of her left carotid artery and carotid artery stenting was felt to be preferred to endarterectomy for that reason.  Risks and benefits were discussed and informed consent was obtained.   DESCRIPTION: After obtaining full informed written consent, the patient was brought back to the vascular suite and placed supine upon the table.  The patient received IV antibiotics prior to induction. Moderate conscious sedation was administered during a face to face encounter with the patient throughout the procedure with my supervision of the RN administering medicines and monitoring the patients vital signs and mental status throughout from the start of the procedure until the patient was taken to the recovery room.  After obtaining adequate anesthesia, the patient was prepped and draped in the standard fashion.   The right femoral artery was visualized with ultrasound and found to be widely patent. It was then accessed under direct ultrasound guidance without  difficulty with a Seldinger needle. A permanent image was recorded. A J-wire was placed and we then placed a 6 French sheath. The patient was then heparinized and a total of 10,000 units of intravenous heparin were given and an ACT was checked to confirm successful anticoagulation. A pigtail catheter was then placed into the ascending aorta. This showed a type I aortic arch with normal origins of the great vessels. I then selectively cannulated the innominate artery without difficulty with a headhunter catheter and advanced into the mid right common carotid artery.  Cervical and cerebral carotid angiography was then performed. There were no obvious intracranial filling defects with extensive cross-filling right to left which is not surprising given her left carotid occlusion. The carotid bifurcation demonstrated a high-grade stenosis of the proximal right internal carotid artery in the 85 to 90% range.  I then advanced into the external carotid artery with a Glidewire and the headhunter catheter and then exchanged for the Amplatz Super Stiff wire. Over the Amplatz Super Stiff wire, a 6 [place] shuttle sheath was placed into the mid common carotid artery. I then used the NAV-6  Embolic protection device and crossed the lesion and parked this in the distal internal carotid artery at the base of the skull.  I then selected a 9 mm proximal, 7 mm distal, 4 cm long exact stent. This was deployed across the lesion encompassing it in its entirety. A 5 mm diameter by 3 cm length balloon was used to post dilate the stent. Only about a 20% residual stenosis was present after angioplasty. Completion angiogram showed normal intracranial filling  without new defects. At this point I elected to terminate the procedure. The sheath was removed and StarClose closure device was deployed in the right femoral artery with excellent hemostatic result. The patient was taken to the recovery room in stable condition having tolerated the  procedure well.  COMPLICATIONS: none  CONDITION: stable  Festus Barren 05/25/2019 11:28 AM   This note was created with Dragon Medical transcription system. Any errors in dictation are purely unintentional.

## 2019-05-25 NOTE — Progress Notes (Addendum)
Shift summary:  - Patient admitted to CCU S/P Carotid Stent. - NIH = 0. - NAD. CTM HR and BP. - OOB to [facility] with stand-by assistance, mild dizziness. - 1635 hrs: PA Stegmayer notified of patient's SB (40s) and HA. Plan: CTM unless patient becomes symptomatic of bradycardia or develops AMS.

## 2019-05-25 NOTE — Plan of Care (Signed)

## 2019-05-26 DIAGNOSIS — I6521 Occlusion and stenosis of right carotid artery: Secondary | ICD-10-CM

## 2019-05-26 LAB — BASIC METABOLIC PANEL
Anion gap: 9 (ref 5–15)
Anion gap: 9 (ref 5–15)
BUN: 37 mg/dL — ABNORMAL HIGH (ref 6–20)
BUN: 37 mg/dL — ABNORMAL HIGH (ref 6–20)
CO2: 21 mmol/L — ABNORMAL LOW (ref 22–32)
CO2: 19 mmol/L — ABNORMAL LOW (ref 22–32)
Calcium: 8.7 mg/dL — ABNORMAL LOW (ref 8.9–10.3)
Calcium: 8.6 mg/dL — ABNORMAL LOW (ref 8.9–10.3)
Chloride: 107 mmol/L (ref 98–111)
Chloride: 107 mmol/L (ref 98–111)
Creatinine, Ser: 1.97 mg/dL — ABNORMAL HIGH (ref 0.44–1.00)
Creatinine, Ser: 1.89 mg/dL — ABNORMAL HIGH (ref 0.44–1.00)
GFR calc Af Amer: 31 mL/min — ABNORMAL LOW (ref >60)
GFR calc Af Amer: 33 mL/min — ABNORMAL LOW (ref >60)
GFR calc non Af Amer: 27 mL/min — ABNORMAL LOW (ref >60)
GFR calc non Af Amer: 28 mL/min — ABNORMAL LOW (ref >60)
Glucose, Bld: 152 mg/dL — ABNORMAL HIGH (ref 70–99)
Glucose, Bld: 151 mg/dL — ABNORMAL HIGH (ref 70–99)
Potassium: 5.7 mmol/L — ABNORMAL HIGH (ref 3.5–5.1)
Potassium: 5.1 mmol/L (ref 3.5–5.1)
Sodium: 137 mmol/L (ref 135–145)
Sodium: 135 mmol/L (ref 135–145)

## 2019-05-26 LAB — CBC
HCT: 33.4 % — ABNORMAL LOW (ref 36.0–46.0)
Hemoglobin: 10.9 g/dL — ABNORMAL LOW (ref 12.0–15.0)
MCH: 30 pg (ref 26.0–34.0)
MCHC: 32.6 g/dL (ref 30.0–36.0)
MCV: 92 fL (ref 80.0–100.0)
Platelets: 242 10*3/uL (ref 150–400)
RBC: 3.63 MIL/uL — ABNORMAL LOW (ref 3.87–5.11)
RDW: 13.5 % (ref 11.5–15.5)
WBC: 19.3 10*3/uL — ABNORMAL HIGH (ref 4.0–10.5)
nRBC: 0 % (ref 0.0–0.2)

## 2019-05-26 LAB — MAGNESIUM: Magnesium: 2 mg/dL (ref 1.7–2.4)

## 2019-05-26 LAB — GLUCOSE, CAPILLARY
Glucose-Capillary: 127 mg/dL — ABNORMAL HIGH (ref 70–99)
Glucose-Capillary: 111 mg/dL — ABNORMAL HIGH (ref 70–99)

## 2019-05-26 MED ORDER — SODIUM CHLORIDE 0.9 % IV BOLUS
1000.0000 mL | Freq: Once | INTRAVENOUS | Status: AC
  Administered 2019-05-26: 1000 mL via INTRAVENOUS

## 2019-05-26 NOTE — Progress Notes (Signed)
Pt discharged at this time. VSS prior to discharge. Husband is here to pick up patient. Discharge education reviewed with patient. No questions at this time. Assessed Right Fem site once more and is a level 0. Gauze is CDI. Taken to [employer] in wheelchair.

## 2019-05-26 NOTE — Discharge Instructions (Signed)
Vascular Surgery Discharge Instructions  1) You may shower as of tomorrow.  Please keep your groins clean and dry. 2) No driving until you are cleared during your first post operative follow-up in our office. 3) No heavy lifting greater than 10 pounds or strenuous activity until you are cleared during your first postoperative visit in the office

## 2019-05-26 NOTE — Discharge Summary (Signed)
[facility]    Discharge Summary  Patient ID:  Alicia Fischer MRN: [id number] DOB/AGE: 09/05/1960 59 y.o.  Admit date: 05/25/2019 Discharge date: 05/26/2019 Date of Surgery: 05/25/2019 Surgeon: Surgeon(s): Algernon Huxley, MD  Admission Diagnosis: Carotid stenosis [I65.29]  Discharge Diagnoses:  Carotid stenosis [I65.29]  Secondary Diagnoses: Past Medical History:  Diagnosis Date  . Chest pain 6/10   probable costochondritits  . CHF (congestive heart failure) ([place])   . Coronary artery disease   . Diabetes mellitus   . Dyslipidemia   . Dyspnea    Chronic, Echo 7/10: EF 55-60%, no valvular problems  . Enlarged heart   . GERD (gastroesophageal reflux disease)   . History of stroke   . Hyperlipidemia    on Lipitor  . Hypertension    on Lisinopril/ HCTZ  . Obesity   . OSA (obstructive sleep apnea)   . Stroke [facility]) 2002   without any residual deficit  . Type 2 diabetes mellitus ([place])    on metformin   Discharged Condition: Good  Hospital Course:  Patient is a 59 y.o. female who presents with greater than 80% right carotid artery stenosis.  The patient has a previous history of stroke and occlusion of her left carotid artery and carotid artery stenting was felt to be preferred to endarterectomy for that reason.  Risks and benefits were discussed and informed consent was obtained. On 05/25/19, the patient underwent:  Procedure(s): 1.  Ultrasound guidance for vascular access right femoral artery 2.  Placement of a 9 mm proximal, 7 mm distal, 4 cm long exact stent with the use of the NAV-6 embolic protection device in the right carotid artery  She tolerated procedure well was transferred from the recovery room to the ICU for observation overnight.  Patient's night of surgery was unremarkable.  Postop day #1, the patient was tolerating a regular diet, she was urinating, her pain was controlled with the use of p.o. pain medication and she was ambulating at  baseline.  Upon discharge, the patient's vitals were stable and her physical exam was unremarkable.  Physical exam:  Alert and oriented x3, NAD Face: Symmetrical, tongue midline Neck: Regular midline.  No swelling or ecchymosis noted. Cardiovascular: Regular rate and rhythm Pulmonary: Clear to auscultation bilaterally Abdomen: Soft, nontender, nondistended, positive bowel sounds Vascular: Extremities are warm distally.  Good capillary refill. Neuro: No neurological deficits noted.  Upper/lower 5 out of 5.  Labs: As below  Complications: None  Consults: None  Significant Diagnostic Studies: CBC Lab Results  Component Value Date   WBC 19.3 (H) 05/26/2019   HGB 10.9 (L) 05/26/2019   HCT 33.4 (L) 05/26/2019   MCV 92.0 05/26/2019   PLT 242 05/26/2019   BMET    Component Value Date/Time   NA 135 05/26/2019 1008   NA 139 09/04/2013 1115   K 5.1 05/26/2019 1008   K 3.8 09/04/2013 1115   CL 107 05/26/2019 1008   CL 105 09/04/2013 1115   CO2 19 (L) 05/26/2019 1008   CO2 29 09/04/2013 1115   GLUCOSE 151 (H) 05/26/2019 1008   GLUCOSE 85 09/04/2013 1115   BUN 37 (H) 05/26/2019 1008   BUN 13 09/04/2013 1115   CREATININE 1.89 (H) 05/26/2019 1008   CREATININE 1.16 09/04/2013 1115   CALCIUM 8.6 (L) 05/26/2019 1008   CALCIUM 9.6 09/04/2013 1115   GFRNONAA 28 (L) 05/26/2019 1008   GFRNONAA 53 (L) 09/04/2013 1115   GFRAA 33 (L) 05/26/2019 1008  GFRAA >60 09/04/2013 1115   COAG Lab Results  Component Value Date   INR 1.0 07/26/2011   INR 1.0 07/10/2011   Disposition:  Discharge to :Home  Allergies as of 05/26/2019      Reactions   Beta Adrenergic Blockers    Codeine Rash   Contrast Media [iodinated Diagnostic Agents] Rash   Other reaction(s): Unknown      Medication List    TAKE these medications   acetaminophen 325 MG tablet Commonly known as: TYLENOL Take 650 mg by mouth daily.   amLODipine 5 MG tablet Commonly known as: NORVASC Take 5 mg by mouth daily.    aspirin 81 MG tablet Take 81 mg by mouth daily.   atorvastatin 80 MG tablet Commonly known as: LIPITOR Take 80 mg by mouth daily.   cetirizine 10 MG tablet Commonly known as: ZYRTEC Take 10 mg by mouth daily.   clopidogrel 75 MG tablet Commonly known as: PLAVIX Take 75 mg by mouth daily.   famotidine 20 MG tablet Commonly known as: PEPCID Take 20 mg by mouth 2 (two) times daily.   gabapentin 300 MG capsule Commonly known as: NEURONTIN Take 300 mg by mouth See admin instructions. Take 1 capsule (300mg ) by mouth every the morning and take 2 capsules (600mg ) every night   losartan 100 MG tablet Commonly known as: COZAAR Take 100 mg by mouth daily.   meloxicam 15 MG tablet Commonly known as: MOBIC Take 15 mg by mouth daily.   nitroGLYCERIN 0.4 MG SL tablet Commonly known as: NITROSTAT Place 0.4 mg under the tongue every 5 (five) minutes as needed for chest pain.      Verbal and written Discharge instructions given to the patient. Wound care per Discharge AVS Follow-up Information    Dew, , MD Follow up in 2 week(s).   Specialties: Vascular Surgery, Radiology, Interventional Cardiology Why: Can see Dew or . Will need a carotid duplex with visit.  Contact information: [address] [place]          Signed: [id number], PA-C  05/26/2019, 11:59 AM

## 2019-06-09 ENCOUNTER — Other Ambulatory Visit (INDEPENDENT_AMBULATORY_CARE_PROVIDER_SITE_OTHER): Payer: Self-pay | Admitting: Vascular Surgery

## 2019-06-09 DIAGNOSIS — Z95828 Presence of other vascular implants and grafts: Secondary | ICD-10-CM

## 2019-06-11 ENCOUNTER — Ambulatory Visit (INDEPENDENT_AMBULATORY_CARE_PROVIDER_SITE_OTHER): Payer: Disability Insurance

## 2019-06-11 ENCOUNTER — Encounter (INDEPENDENT_AMBULATORY_CARE_PROVIDER_SITE_OTHER): Payer: Self-pay | Admitting: Nurse Practitioner

## 2019-06-11 ENCOUNTER — Ambulatory Visit (INDEPENDENT_AMBULATORY_CARE_PROVIDER_SITE_OTHER): Payer: Disability Insurance | Admitting: Nurse Practitioner

## 2019-06-11 ENCOUNTER — Other Ambulatory Visit: Payer: Self-pay

## 2019-06-11 VITALS — BP 136/81 | HR 91 | Resp 16 | Ht 67.0 in | Wt 294.0 lb

## 2019-06-11 DIAGNOSIS — Z9889 Other specified postprocedural states: Secondary | ICD-10-CM

## 2019-06-11 DIAGNOSIS — E1151 Type 2 diabetes mellitus with diabetic peripheral angiopathy without gangrene: Secondary | ICD-10-CM

## 2019-06-11 DIAGNOSIS — I10 Essential (primary) hypertension: Secondary | ICD-10-CM

## 2019-06-11 DIAGNOSIS — I6523 Occlusion and stenosis of bilateral carotid arteries: Secondary | ICD-10-CM

## 2019-06-11 DIAGNOSIS — Z95828 Presence of other vascular implants and grafts: Secondary | ICD-10-CM

## 2019-06-16 ENCOUNTER — Encounter (INDEPENDENT_AMBULATORY_CARE_PROVIDER_SITE_OTHER): Payer: Self-pay | Admitting: Nurse Practitioner

## 2019-06-16 NOTE — Progress Notes (Signed)
Subjective:    Patient ID: Alicia Fischer, female    DOB: Mar 28, 1960, 59 y.o.   MRN: [id number] Chief Complaint  Patient presents with  . Follow-up    ultrasound follow up     The patient is seen for follow up evaluation of carotid stenosis status post right carotid stenting on 05/25/2019.  There were no post operative problems or complications related to the surgery.  The patient denies neck or groin pain.  The patient denies interval amaurosis fugax. There is no recent history of TIA symptoms or focal motor deficits. There is a prior documented CVA years ago but no residual effects.    The patient denies headache.  The patient is taking enteric-coated aspirin 81 mg daily.  The patient has a history of coronary artery disease, no recent episodes of angina or shortness of breath. The patient denies PAD or claudication symptoms. There is a history of hyperlipidemia which is being treated with a statin  Non invasive studies show a Right ICA with patent stent and 40-59% stenosis.  The left ICA is occluded.      Review of Systems  Hematological: Bruises/bleeds easily.  All other systems reviewed and are negative.      Objective:   Physical Exam Vitals reviewed.  Constitutional:      Appearance: Normal appearance.  HENT:     Head: Normocephalic.  Cardiovascular:     Rate and Rhythm: Normal rate.     Pulses: Normal pulses.  Pulmonary:     Effort: Pulmonary effort is normal.  Skin:    General: Skin is warm and dry.  Neurological:     Mental Status: She is alert.     BP 136/81 (BP Location: Right Arm)   Pulse 91   Resp 16   Ht 5\' 7"  (1.702 m)   Wt 294 lb (133.4 kg)   BMI 46.05 kg/m   Past Medical History:  Diagnosis Date  . Chest pain 6/10   probable costochondritits  . CHF (congestive heart failure) (HCC)   . Coronary artery disease   . Diabetes mellitus   . Dyslipidemia   . Dyspnea    Chronic, Echo 7/10: EF 55-60%, no valvular problems  . Enlarged heart    . GERD (gastroesophageal reflux disease)   . History of stroke   . Hyperlipidemia    on Lipitor  . Hypertension    on Lisinopril/ HCTZ  . Obesity   . OSA (obstructive sleep apnea)   . Stroke [facility]) 2002   without any residual deficit  . Type 2 diabetes mellitus (HCC)    on metformin    Social History   Socioeconomic History  . Marital status: Married    Spouse name: Not on file  . Number of children: Not on file  . Years of education: Not on file  . Highest education level: Not on file  Occupational History  . Occupation: CNA  Tobacco Use  . Smoking status: Former Smoker    Types: Cigarettes    Quit date: 2001    Years since quitting: 20.2  . Smokeless tobacco: Never Used  Substance and Sexual Activity  . Alcohol use: No  . Drug use: Never  . Sexual activity: Not on file  Other Topics Concern  . Not on file  Social History Narrative   Lives in [place]   Social Determinants of Health   Financial Resource Strain:   . Difficulty of Paying Living Expenses:   Food Insecurity:   .  Worried About Programme researcher, broadcasting/film/video in the Last Year:   . Barista in the Last Year:   Transportation Needs:   . Freight forwarder (Medical):   Marland Kitchen Lack of Transportation (Non-Medical):   Physical Activity:   . Days of Exercise per Week:   . Minutes of Exercise per Session:   Stress:   . Feeling of Stress :   Social Connections:   . Frequency of Communication with Friends and Family:   . Frequency of Social Gatherings with Friends and Family:   . Attends Religious Services:   . Active Member of Clubs or Organizations:   . Attends Banker Meetings:   Marland Kitchen Marital Status:   Intimate Partner Violence:   . Fear of Current or Ex-Partner:   . Emotionally Abused:   Marland Kitchen Physically Abused:   . Sexually Abused:     Past Surgical History:  Procedure Laterality Date  . BREAST BIOPSY Left 2014   neg  . CARDIAC CATHETERIZATION  2010  . CAROTID PTA/STENT  INTERVENTION Right 05/25/2019   Procedure: CAROTID PTA/STENT INTERVENTION;  Surgeon: Annice Needy, MD;  Location: [facility] INVASIVE CV LAB;  Service: Cardiovascular;  Laterality: Right;  . CERVICAL ABLATION      Family History  Problem Relation Age of Onset  . Heart attack Father 74  . Breast cancer Mother 75  . Heart attack Mother     Allergies  Allergen Reactions  . Beta Adrenergic Blockers   . Codeine Rash  . Contrast Media [Iodinated Diagnostic Agents] Rash    Other reaction(s): Unknown       Assessment & Plan:   1. Bilateral carotid artery stenosis Recommend:  The patient is s/p successful right ICA stent   Duplex ultrasound today shows 40-59% stenosis of the RICA and occlusion of the LICA   Continue antiplatelet therapy as prescribed Continue management of CAD, HTN and Hyperlipidemia Healthy heart diet,  encouraged exercise at least 4 times per week  Follow up in 3 months with duplex ultrasound and physical exam based on the patient's carotid surgery and 50% stenosis of the right carotid artery   - VAS US CAROTID; Future  2. HYPERTENSION, BENIGN Continue antihypertensive medications as already ordered, these medications have been reviewed and there are no changes at this time.   3. Type 2 diabetes mellitus with diabetic peripheral angiopathy without gangrene, unspecified whether long term insulin use (HCC) Continue hypoglycemic medications as already ordered, these medications have been reviewed and there are no changes at this time.  Hgb A1C to be monitored as already arranged by primary service    Current Outpatient Medications on File Prior to Visit  Medication Sig Dispense Refill  . acetaminophen (TYLENOL) 325 MG tablet Take 650 mg by mouth daily.     Marland Kitchen amLODipine (NORVASC) 5 MG tablet Take 5 mg by mouth daily.    Marland Kitchen aspirin 81 MG tablet Take 81 mg by mouth daily.      Marland Kitchen atorvastatin (LIPITOR) 80 MG tablet Take 80 mg by mouth daily.     . cetirizine (ZYRTEC)  10 MG tablet Take 10 mg by mouth daily.    . clopidogrel (PLAVIX) 75 MG tablet Take 75 mg by mouth daily.    . famotidine (PEPCID) 20 MG tablet Take 20 mg by mouth 2 (two) times daily.     Marland Kitchen gabapentin (NEURONTIN) 300 MG capsule Take 300 mg by mouth See admin instructions. Take 1 capsule (300mg ) by mouth every  the morning and take 2 capsules (600mg ) every night    . meloxicam (MOBIC) 15 MG tablet Take 15 mg by mouth daily.     . nitroGLYCERIN (NITROSTAT) 0.4 MG SL tablet Place 0.4 mg under the tongue every 5 (five) minutes as needed for chest pain.     Marland Kitchen losartan (COZAAR) 100 MG tablet Take 100 mg by mouth daily.      No current facility-administered medications on file prior to visit.    There are no Patient Instructions on file for this visit. No follow-ups on file.   Kris Hartmann, NP

## 2019-07-29 ENCOUNTER — Emergency Department
Admission: EM | Admit: 2019-07-29 | Discharge: 2019-07-29 | Disposition: A | Discharge status: Home / Self Care | Payer: Self-pay | Attending: Emergency Medicine | Admitting: Emergency Medicine

## 2019-07-29 ENCOUNTER — Other Ambulatory Visit: Payer: Self-pay

## 2019-07-29 ENCOUNTER — Emergency Department: Payer: Self-pay

## 2019-07-29 ENCOUNTER — Encounter: Payer: Self-pay | Admitting: Emergency Medicine

## 2019-07-29 DIAGNOSIS — Z7984 Long term (current) use of oral hypoglycemic drugs: Secondary | ICD-10-CM | POA: Insufficient documentation

## 2019-07-29 DIAGNOSIS — M1712 Unilateral primary osteoarthritis, left knee: Secondary | ICD-10-CM | POA: Insufficient documentation

## 2019-07-29 DIAGNOSIS — E119 Type 2 diabetes mellitus without complications: Secondary | ICD-10-CM | POA: Insufficient documentation

## 2019-07-29 MED ORDER — TRAMADOL HCL 50 MG PO TABS: 50.0000 mg | ORAL_TABLET | Freq: Four times a day (QID) | ORAL | 0 refills | Status: DC | PRN

## 2019-07-29 MED ORDER — NAPROXEN 500 MG PO TABS: 500.0000 mg | ORAL_TABLET | Freq: Two times a day (BID) | ORAL | 0 refills | Status: DC

## 2019-07-29 NOTE — ED Notes (Signed)
Pt c/o left knee pain for the past week, saw her PCP and was dx with arthritis. Denies injury.Marland Kitchen

## 2019-07-29 NOTE — ED Triage Notes (Signed)
Pt here for left knee pain for 1 week.  Saw pcp who thought it was arthritis but did not do xray.  No visible deformity.  Worse with ambulation and flexion/extension.  Does not remember injury.

## 2019-07-29 NOTE — Discharge Instructions (Signed)
Please follow up with orthopedics if not improving over the week.  Return to the ER for symptoms that change or worsen or for new concerns.

## 2019-07-29 NOTE — ED Provider Notes (Signed)
[facility] Emergency Department Provider Note ____________________________________________  Time seen: Approximately 1:51 PM  I have reviewed the triage vital signs and the nursing notes.   HISTORY  Chief Complaint Knee Pain    HPI Alicia Fischer is a 59 y.o. female who presents to the emergency department for evaluation and treatment of knee pain for the past week.  No injury.  She was evaluated by her primary care provider a week ago and was diagnosed with arthritis.  No x-rays were performed that day.  Pain increases with flexion, full extension, and weightbearing.  No relief with Mobic.   Past Medical History:  Diagnosis Date  . Chest pain 6/10   probable costochondritits  . CHF (congestive heart failure) (HCC)   . Coronary artery disease   . Diabetes mellitus   . Dyslipidemia   . Dyspnea    Chronic, Echo 7/10: EF 55-60%, no valvular problems  . Enlarged heart   . GERD (gastroesophageal reflux disease)   . History of stroke   . Hyperlipidemia    on Lipitor  . Hypertension    on Lisinopril/ HCTZ  . Obesity   . OSA (obstructive sleep apnea)   . Stroke [facility]) 2002   without any residual deficit  . Type 2 diabetes mellitus (HCC)    on metformin    Patient Active Problem List   Diagnosis Date Noted  . Carotid stenosis 04/14/2019  . Abnormal results of liver function studies 09/14/2013  . Abnormal mammogram 09/14/2013  . Bunion 09/14/2013  . Disorder of kidney and ureter 09/14/2013  . Edema 09/14/2013  . Generalized osteoarthritis of multiple sites 09/14/2013  . Morbid obesity (HCC) 09/14/2013  . Gout, unspecified 09/14/2013  . Obstructive sleep apnea 09/14/2013  . Pain in joint involving ankle and foot 09/14/2013  . Cramp of limb 09/14/2013  . Restless legs syndrome (RLS) 09/14/2013  . Diabetes (HCC) 09/14/2013  . SHORTNESS OF BREATH 09/15/2008  . Hyperlipidemia 09/11/2008  . HYPERTENSION, BENIGN 09/11/2008  . CHEST PAIN-UNSPECIFIED  09/11/2008    Past Surgical History:  Procedure Laterality Date  . BREAST BIOPSY Left 2014   neg  . CARDIAC CATHETERIZATION  2010  . CAROTID PTA/STENT INTERVENTION Right 05/25/2019   Procedure: CAROTID PTA/STENT INTERVENTION;  Surgeon: Annice Needy, MD;  Location: [facility] INVASIVE CV LAB;  Service: Cardiovascular;  Laterality: Right;  . CERVICAL ABLATION      Prior to Admission medications   Medication Sig Start Date End Date Taking? Authorizing Provider  acetaminophen (TYLENOL) 325 MG tablet Take 650 mg by mouth daily.     [provider]  amLODipine (NORVASC) 5 MG tablet Take 5 mg by mouth daily.    [provider]  aspirin 81 MG tablet Take 81 mg by mouth daily.      [provider]  atorvastatin (LIPITOR) 80 MG tablet Take 80 mg by mouth daily.     [provider]  cetirizine (ZYRTEC) 10 MG tablet Take 10 mg by mouth daily.    [provider]  clopidogrel (PLAVIX) 75 MG tablet Take 75 mg by mouth daily.    [provider]  famotidine (PEPCID) 20 MG tablet Take 20 mg by mouth 2 (two) times daily.     [provider]  gabapentin (NEURONTIN) 300 MG capsule Take 300 mg by mouth See admin instructions. Take 1 capsule (300mg ) by mouth every the morning and take 2 capsules (600mg ) every night 04/25/18   [provider]  losartan (  COZAAR) 100 MG tablet Take 100 mg by mouth daily.  08/05/18   [provider]  meloxicam (MOBIC) 15 MG tablet Take 15 mg by mouth daily.  09/24/18   [provider]  naproxen (NAPROSYN) 500 MG tablet Take 1 tablet (500 mg total) by mouth 2 (two) times daily with a meal. 07/29/19   Graviel Payeur B, FNP  nitroGLYCERIN (NITROSTAT) 0.4 MG SL tablet Place 0.4 mg under the tongue every 5 (five) minutes as needed for chest pain.     [provider]  traMADol (ULTRAM) 50 MG tablet Take 1 tablet (50 mg total) by mouth every 6 (six) hours as needed. 07/29/19   Jasman Murri, Johnette Abraham B, FNP     Allergies Beta adrenergic blockers, Codeine, and Contrast media [iodinated diagnostic agents]  Family History  Problem Relation Age of Onset  . Heart attack Father 36  . Breast cancer Mother 71  . Heart attack Mother     Social History Social History   Tobacco Use  . Smoking status: Former Smoker    Types: Cigarettes    Quit date: 2001    Years since quitting: 20.3  . Smokeless tobacco: Never Used  Substance Use Topics  . Alcohol use: No  . Drug use: Never    Review of Systems Constitutional: Negative for fever. Cardiovascular: Negative for chest pain. Respiratory: Negative for shortness of breath. Musculoskeletal: Positive for left knee pain. Skin: Negative for open wounds or lesions. Neurological: Negative for decrease in sensation  ____________________________________________   PHYSICAL EXAM:  VITAL SIGNS: ED Triage Vitals  Enc Vitals Group     BP 07/29/19 0849 (!) 158/80     Pulse Rate 07/29/19 0849 81     Resp 07/29/19 0849 20     Temp 07/29/19 0849 98.1 F (36.7 C)     Temp Source 07/29/19 0849 Oral     SpO2 07/29/19 0849 96 %     Weight 07/29/19 0848 290 lb (131.5 kg)     Height 07/29/19 0848 5\' 8"  (1.727 m)     Head Circumference --      Peak Flow --      Pain Score 07/29/19 0859 9     Pain Loc --      Pain Edu? --      Excl. in [place]? --     Constitutional: Alert and oriented. Well appearing and in no acute distress. Eyes: Conjunctivae are clear without discharge or drainage Head: Atraumatic Neck: Supple. Respiratory: No cough. Respirations are even and unlabored. Musculoskeletal: No focal tenderness over the left knee.  Patient demonstrates flexion and extension, but does complain increased pain with motion. Neurologic: Motor and sensory function is intact. Skin: No open wounds or lesions overlying the left lower extremity. Psychiatric: Affect and behavior are appropriate.  ____________________________________________   LABS (all labs  ordered are listed, but only abnormal results are displayed)  Labs Reviewed - No data to display ____________________________________________  RADIOLOGY  Images of the left knee shows mild mid compartment osteoarthritis.  I, Sherrie George, personally viewed and evaluated these images (plain radiographs) as part of my medical decision making, as well as reviewing the written report by the radiologist.  DG Knee Complete 4 Views Left  Result Date: 07/29/2019 CLINICAL DATA:  Pain EXAM: LEFT KNEE - COMPLETE 4+ VIEW COMPARISON:  None. FINDINGS: Alignment is anatomic. There is no acute fracture. Possible joint effusion with evaluation limited by obliquity of lateral view. Mild changes of osteoarthritis primarily in the medial  compartment. Vascular calcification is noted. IMPRESSION: Mild osteoarthritis.  Possible joint effusion Electronically Signed   By: Guadlupe Spanish M.D.   On: 07/29/2019 09:38   ____________________________________________   PROCEDURES  Procedures  ____________________________________________   INITIAL IMPRESSION / ASSESSMENT AND PLAN / ED COURSE  Alicia Fischer is a 59 y.o. who presents to the emergency department for treatment and evaluation of nontraumatic left knee pain.  See HPI for further details.  X-ray obtained.  Mild osteoarthritis without any acute findings.  She will be placed in a hinged knee brace and encouraged to call and schedule follow-up with orthopedics.  She was also instructed to return to the emergency department for symptoms of change or worsen if she is unable to see primary care or orthopedics. Medications - No data to display  Pertinent labs & imaging results that were available during my care of the patient were reviewed by me and considered in my medical decision making (see chart for details).   _________________________________________   FINAL CLINICAL IMPRESSION(S) / ED DIAGNOSES  Final diagnoses:  Primary osteoarthritis of left knee     ED Discharge Orders         Ordered    traMADol (ULTRAM) 50 MG tablet  Every 6 hours PRN     07/29/19 1053    naproxen (NAPROSYN) 500 MG tablet  2 times daily with meals     07/29/19 1053           If controlled substance prescribed during this visit, 12 month history viewed on the [facility] prior to issuing an initial prescription for Schedule II or III opiod.   Chinita Pester, FNP 07/29/19 1354    Shaune Pollack, MD 07/30/19 2031

## 2019-08-11 ENCOUNTER — Other Ambulatory Visit (INDEPENDENT_AMBULATORY_CARE_PROVIDER_SITE_OTHER): Payer: Self-pay | Admitting: Vascular Surgery

## 2019-09-15 ENCOUNTER — Ambulatory Visit (INDEPENDENT_AMBULATORY_CARE_PROVIDER_SITE_OTHER): Payer: Disability Insurance | Admitting: Vascular Surgery

## 2019-09-15 ENCOUNTER — Encounter (INDEPENDENT_AMBULATORY_CARE_PROVIDER_SITE_OTHER): Payer: Disability Insurance

## 2019-09-16 ENCOUNTER — Ambulatory Visit (INDEPENDENT_AMBULATORY_CARE_PROVIDER_SITE_OTHER): Payer: Disability Insurance

## 2019-09-16 ENCOUNTER — Encounter (INDEPENDENT_AMBULATORY_CARE_PROVIDER_SITE_OTHER): Payer: Self-pay | Admitting: Nurse Practitioner

## 2019-09-16 ENCOUNTER — Other Ambulatory Visit: Payer: Self-pay

## 2019-09-16 ENCOUNTER — Ambulatory Visit (INDEPENDENT_AMBULATORY_CARE_PROVIDER_SITE_OTHER): Payer: Disability Insurance | Admitting: Nurse Practitioner

## 2019-09-16 VITALS — BP 138/80 | HR 83 | Ht 67.0 in | Wt 291.0 lb

## 2019-09-16 DIAGNOSIS — E785 Hyperlipidemia, unspecified: Secondary | ICD-10-CM

## 2019-09-16 DIAGNOSIS — I10 Essential (primary) hypertension: Secondary | ICD-10-CM

## 2019-09-16 DIAGNOSIS — I6523 Occlusion and stenosis of bilateral carotid arteries: Secondary | ICD-10-CM

## 2019-09-16 DIAGNOSIS — E1151 Type 2 diabetes mellitus with diabetic peripheral angiopathy without gangrene: Secondary | ICD-10-CM

## 2019-09-16 NOTE — Progress Notes (Signed)
Subjective:    Patient ID: Alicia Fischer, female    DOB: [birth year], 59 y.o.   MRN: [id number] Chief Complaint  Patient presents with  . Follow-up    u/S follow up    The patient is seen for follow up evaluation of carotid stenosis. The carotid stenosis followed by ultrasound.  Patient had a right carotid stent placed on 05/25/2019.  The patient also has a known left internal carotid artery occlusion.  The patient denies amaurosis fugax. There is no recent history of TIA symptoms or focal motor deficits. There is no prior documented CVA.  The patient is taking enteric-coated aspirin 81 mg daily.  There is no history of migraine headaches. There is no history of seizures.  The patient has a history of coronary artery disease, no recent episodes of angina or shortness of breath. The patient denies PAD or claudication symptoms. There is a history of hyperlipidemia which is being treated with a statin.    Carotid Duplex done today shows 40 to 59% stenosis of the right carotid with occlusion of the left internal carotid artery, this is consistent with previous study done on 06/11/2019   Review of Systems  Neurological: Negative for weakness.  All other systems reviewed and are negative.      Objective:   Physical Exam Vitals reviewed.  HENT:     Head: Normocephalic.  Neck:     Vascular: No carotid bruit.  Cardiovascular:     Rate and Rhythm: Normal rate and regular rhythm.     Pulses: Normal pulses.     Heart sounds: Normal heart sounds.  Pulmonary:     Effort: Pulmonary effort is normal.     Breath sounds: Normal breath sounds.  Skin:    General: Skin is warm and dry.  Neurological:     Mental Status: She is alert and oriented to person, place, and time.  Psychiatric:        Mood and Affect: Mood normal.        Behavior: Behavior normal.        Thought Content: Thought content normal.        Judgment: Judgment normal.     BP 138/80   Pulse 83   Ht 5\' 7"  (1.702  m)   Wt 291 lb (132 kg)   BMI 45.58 kg/m   Past Medical History:  Diagnosis Date  . Chest pain 6/10   probable costochondritits  . CHF (congestive heart failure) (HCC)   . Coronary artery disease   . Diabetes mellitus   . Dyslipidemia   . Dyspnea    Chronic, Echo 7/10: EF 55-60%, no valvular problems  . Enlarged heart   . GERD (gastroesophageal reflux disease)   . History of stroke   . Hyperlipidemia    on Lipitor  . Hypertension    on Lisinopril/ HCTZ  . Obesity   . OSA (obstructive sleep apnea)   . Stroke [facility]) 2002   without any residual deficit  . Type 2 diabetes mellitus (HCC)    on metformin    Social History   Socioeconomic History  . Marital status: Married    Spouse name: Not on file  . Number of children: Not on file  . Years of education: Not on file  . Highest education level: Not on file  Occupational History  . Occupation: CNA  Tobacco Use  . Smoking status: Former Smoker    Types: Cigarettes    Quit date: 2001  Years since quitting: 20.5  . Smokeless tobacco: Never Used  Vaping Use  . Vaping Use: Never used  Substance and Sexual Activity  . Alcohol use: No  . Drug use: Never  . Sexual activity: Not on file  Other Topics Concern  . Not on file  Social History Narrative   Lives in [place]   Social Determinants of Health   Financial Resource Strain:   . Difficulty of Paying Living Expenses:   Food Insecurity:   . Worried About Programme researcher, broadcasting/film/video in the Last Year:   . Barista in the Last Year:   Transportation Needs:   . Freight forwarder (Medical):   Marland Kitchen Lack of Transportation (Non-Medical):   Physical Activity:   . Days of Exercise per Week:   . Minutes of Exercise per Session:   Stress:   . Feeling of Stress :   Social Connections:   . Frequency of Communication with Friends and Family:   . Frequency of Social Gatherings with Friends and Family:   . Attends Religious Services:   . Active Member of Clubs or  Organizations:   . Attends Banker Meetings:   Marland Kitchen Marital Status:   Intimate Partner Violence:   . Fear of Current or Ex-Partner:   . Emotionally Abused:   Marland Kitchen Physically Abused:   . Sexually Abused:     Past Surgical History:  Procedure Laterality Date  . BREAST BIOPSY Left 2014   neg  . CARDIAC CATHETERIZATION  2010  . CAROTID PTA/STENT INTERVENTION Right 05/25/2019   Procedure: CAROTID PTA/STENT INTERVENTION;  Surgeon: Annice Needy, MD;  Location: [facility] INVASIVE CV LAB;  Service: Cardiovascular;  Laterality: Right;  . CERVICAL ABLATION      Family History  Problem Relation Age of Onset  . Heart attack Father 70  . Breast cancer Mother 30  . Heart attack Mother     Allergies  Allergen Reactions  . Atenolol Other (See Comments)  . Beta Adrenergic Blockers   . Metformin Other (See Comments)  . Sulfa Antibiotics Nausea And Vomiting  . Codeine Rash and Other (See Comments)  . Contrast Media [Iodinated Diagnostic Agents] Rash    Other reaction(s): Unknown       Assessment & Plan:   1. Bilateral carotid artery stenosis Recommend:  The patient is s/p successful right stent placement  Duplex ultrasound shows occluded left carotid artery  Continue antiplatelet therapy as prescribed.  Based on the patient's 1 occluded carotid artery and stenting of her other, and she can plan on lifetime antiplatelet therapy. Continue management of CAD, HTN and Hyperlipidemia Healthy heart diet,  encouraged exercise at least 4 times per week  Follow up in 6 months with duplex ultrasound and physical exam based on the patient's carotid stenting.   2. HYPERTENSION, BENIGN Continue antihypertensive medications as already ordered, these medications have been reviewed and there are no changes at this time.   3. Hyperlipidemia, unspecified hyperlipidemia type Continue statin as ordered and reviewed, no changes at this time   4. Type 2 diabetes mellitus with diabetic peripheral  angiopathy without gangrene, unspecified whether long term insulin use (HCC) Continue hypoglycemic medications as already ordered, these medications have been reviewed and there are no changes at this time.  Hgb A1C to be monitored as already arranged by primary service    Current Outpatient Medications on File Prior to Visit  Medication Sig Dispense Refill  . acetaminophen (TYLENOL) 325 MG tablet Take  650 mg by mouth daily.     Marland Kitchen amLODipine (NORVASC) 5 MG tablet Take 5 mg by mouth daily.    Marland Kitchen aspirin 81 MG tablet Take 81 mg by mouth daily.      Marland Kitchen atorvastatin (LIPITOR) 80 MG tablet Take 80 mg by mouth daily.     . cetirizine (ZYRTEC) 10 MG tablet Take 10 mg by mouth daily.    . clopidogrel (PLAVIX) 75 MG tablet TAKE 1 TABLET EVERY DAY 30 tablet 3  . famotidine (PEPCID) 20 MG tablet Take 20 mg by mouth 2 (two) times daily.     Marland Kitchen gabapentin (NEURONTIN) 300 MG capsule Take 300 mg by mouth See admin instructions. Take 1 capsule (300mg ) by mouth every the morning and take 2 capsules (600mg ) every night    . losartan (COZAAR) 100 MG tablet Take 100 mg by mouth daily.     . meloxicam (MOBIC) 15 MG tablet Take 15 mg by mouth daily.     . naproxen (NAPROSYN) 500 MG tablet Take 1 tablet (500 mg total) by mouth 2 (two) times daily with a meal. 30 tablet 0  . nitroGLYCERIN (NITROSTAT) 0.4 MG SL tablet Place 0.4 mg under the tongue every 5 (five) minutes as needed for chest pain.     . traMADol (ULTRAM) 50 MG tablet Take 1 tablet (50 mg total) by mouth every 6 (six) hours as needed. 12 tablet 0   No current facility-administered medications on file prior to visit.    There are no Patient Instructions on file for this visit. No follow-ups on file.   , NP

## 2020-01-01 ENCOUNTER — Other Ambulatory Visit (INDEPENDENT_AMBULATORY_CARE_PROVIDER_SITE_OTHER): Payer: Self-pay | Admitting: Vascular Surgery

## 2020-02-16 ENCOUNTER — Ambulatory Visit: Payer: Self-pay | Attending: Oncology | Admitting: *Deleted

## 2020-02-16 ENCOUNTER — Ambulatory Visit
Admission: RE | Admit: 2020-02-16 | Discharge: 2020-02-16 | Disposition: A | Discharge status: Home / Self Care | Payer: Self-pay | Source: Ambulatory Visit | Attending: Oncology | Admitting: Oncology

## 2020-02-16 ENCOUNTER — Other Ambulatory Visit: Payer: Self-pay

## 2020-02-16 VITALS — BP 130/85 | HR 80 | Temp 98.4°F | Ht 68.0 in | Wt 282.0 lb

## 2020-02-16 DIAGNOSIS — Z0000 Encounter for general adult medical examination without abnormal findings: Secondary | ICD-10-CM

## 2020-02-16 NOTE — Progress Notes (Signed)
  Subjective:     Patient ID: Alicia Fischer, female   DOB: October 11, 1960, 59 y.o.   MRN: [id number]  HPI   B[facility]CP Medical History Record - 02/16/20 9242      Breast History   Screening cycle New    CBE Date 02/16/20    Provider (CBE) Ina Homes    Initial Mammogram 02/16/20    Last Mammogram Annual    Last Mammogram Date 10/14/18    Provider (Mammogram)  Delford Field    Recent Breast Symptoms None      Breast Cancer History   Breast Cancer History No personal or family history      Previous History of Breast Problems   Breast Surgery or Biopsy Left   left 2014 benign   Breast Implants N/A    BSE Done Monthly      Gynecological/Obstetrical History   LMP 10/13/08    Is there any chance that the client could be pregnant?  No    Age [age] menarche [age]    Age at menopause 59    PAP smear history Annually    Breast fed children No    DES Exposure No    Cervical, Uterine or Ovarian cancer No    Family history of Cervial, Uterine or Ovarian cancer No    Hysterectomy No    Cervix removed No    Ovaries removed No    Laser/Cryosurgery No    Current method of birth control None    Current method of Estrogen/Hormone replacement None    Smoking history None           Review of Systems     Objective:   Physical Exam Chest:     Breasts:        Right: No swelling, bleeding, inverted nipple, mass, nipple discharge, skin change or tenderness.        Left: No swelling, bleeding, inverted nipple, mass, nipple discharge, skin change or tenderness.  Lymphadenopathy:     Upper Body:     Right upper body: No supraclavicular or axillary adenopathy.     Left upper body: No supraclavicular or axillary adenopathy.        Assessment:     59 year old Black female returns to [facility] for annual screening.  Clinical breast exam unremarkable.  Taught self breast awareness.  Last pap on 04/04/16 was negative / negative.  Next pap due in 2023.  Patient has been screened for eligibility.  She  does not have any insurance, Medicare or Medicaid.  She also meets financial eligibility.   Risk Assessment   No risk assessment data for the current encounter  Risk Scores      10/14/2018   Last edited by: Jim Like, RN   5-year risk: 2.3 %   Lifetime risk: 11.1 %            Plan:      Screening mammogram ordered.  Will follow up per BCCCP protocol.

## 2020-02-17 ENCOUNTER — Ambulatory Visit: Payer: Self-pay

## 2020-02-19 ENCOUNTER — Encounter: Payer: Self-pay | Admitting: *Deleted

## 2020-02-19 NOTE — Progress Notes (Signed)
Letter mailed from the [facility] to inform patient of her normal mammogram results.  Patient is to follow-up with annual screening in one year. 

## 2020-03-22 ENCOUNTER — Ambulatory Visit (INDEPENDENT_AMBULATORY_CARE_PROVIDER_SITE_OTHER): Payer: Self-pay | Admitting: Vascular Surgery

## 2020-03-22 ENCOUNTER — Other Ambulatory Visit (INDEPENDENT_AMBULATORY_CARE_PROVIDER_SITE_OTHER): Payer: Self-pay | Admitting: Nurse Practitioner

## 2020-03-22 ENCOUNTER — Encounter (INDEPENDENT_AMBULATORY_CARE_PROVIDER_SITE_OTHER): Payer: Self-pay | Admitting: Vascular Surgery

## 2020-03-22 ENCOUNTER — Other Ambulatory Visit: Payer: Self-pay

## 2020-03-22 ENCOUNTER — Ambulatory Visit (INDEPENDENT_AMBULATORY_CARE_PROVIDER_SITE_OTHER): Payer: Self-pay

## 2020-03-22 VITALS — BP 142/73 | HR 79 | Resp 16 | Wt 280.6 lb

## 2020-03-22 DIAGNOSIS — I6523 Occlusion and stenosis of bilateral carotid arteries: Secondary | ICD-10-CM

## 2020-03-22 DIAGNOSIS — I10 Essential (primary) hypertension: Secondary | ICD-10-CM

## 2020-03-22 DIAGNOSIS — E785 Hyperlipidemia, unspecified: Secondary | ICD-10-CM

## 2020-03-22 DIAGNOSIS — E1151 Type 2 diabetes mellitus with diabetic peripheral angiopathy without gangrene: Secondary | ICD-10-CM

## 2020-03-22 NOTE — Assessment & Plan Note (Signed)
Her carotid duplex today reveals a widely patent right carotid artery stent with no evidence of restenosis.  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  She will continue her aspirin, Plavix, and statin agent.  We will check this again in 6 months and if things are stable, we can then go to a once a year follow-up.

## 2020-03-22 NOTE — Progress Notes (Signed)
MRN : [id number]  Alicia Fischer is a 60 y.o. (1960/12/14) female who presents with chief complaint of  Chief Complaint  Patient presents with  . Carotid    6 month follow up  .  History of Present Illness: Patient returns today in follow up of her carotid disease.  About 10 months ago, she underwent right carotid stent placement for high-grade stenosis with contralateral occlusion and previous stroke.  She did well from this.  She has had no complications since the procedure.  She has no focal neurologic symptoms currently. Specifically, the patient denies amaurosis fugax, speech or swallowing difficulties, or arm or leg weakness or numbness.  Her carotid duplex today reveals a widely patent right carotid artery stent with no evidence of restenosis.  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  Current Outpatient Medications  Medication Sig Dispense Refill  . acetaminophen (TYLENOL) 325 MG tablet Take 650 mg by mouth daily.     Marland Kitchen amLODipine (NORVASC) 5 MG tablet Take 5 mg by mouth daily.    Marland Kitchen aspirin 81 MG tablet Take 81 mg by mouth daily.    Marland Kitchen atorvastatin (LIPITOR) 80 MG tablet Take 80 mg by mouth daily.    . cetirizine (ZYRTEC) 10 MG tablet Take 10 mg by mouth daily.    . clopidogrel (PLAVIX) 75 MG tablet TAKE 1 TABLET BY MOUTH EVERY DAY 30 tablet 3  . famotidine (PEPCID) 20 MG tablet Take 20 mg by mouth 2 (two) times daily.     Marland Kitchen gabapentin (NEURONTIN) 300 MG capsule Take 300 mg by mouth See admin instructions. Take 1 capsule (300mg ) by mouth every the morning and take 2 capsules (600mg ) every night    . hydrochlorothiazide (MICROZIDE) 12.5 MG capsule TAKE (1) CAPSULE DAILY FOR HIGH BLOOD PRESSURE.    . naproxen (NAPROSYN) 500 MG tablet Take 1 tablet (500 mg total) by mouth 2 (two) times daily with a meal. 30 tablet 0  . nitroGLYCERIN (NITROSTAT) 0.4 MG SL tablet Place 0.4 mg under the tongue every 5 (five) minutes as needed for chest pain.     losartan (COZAAR) 100  MG tablet Take 100 mg by mouth daily.  (Patient not taking: Reported on 03/22/2020)    . meloxicam (MOBIC) 15 MG tablet Take 15 mg by mouth daily.  (Patient not taking: Reported on 03/22/2020)    . traMADol (ULTRAM) 50 MG tablet Take 1 tablet (50 mg total) by mouth every 6 (six) hours as needed. (Patient not taking: Reported on 03/22/2020) 12 tablet 0   No current facility-administered medications for this visit.    Past Medical History:  Diagnosis Date  . Chest pain 6/10   probable costochondritits  . CHF (congestive heart failure) (HCC)   . Coronary artery disease   . Diabetes mellitus   . Dyslipidemia   . Dyspnea    Chronic, Echo 7/10: EF 55-60%, no valvular problems  . Enlarged heart   . GERD (gastroesophageal reflux disease)   . History of stroke   . Hyperlipidemia    on Lipitor  . Hypertension    on Lisinopril/ HCTZ  . Obesity   . OSA (obstructive sleep apnea)   . Stroke [facility]) 2002   without any residual deficit  . Type 2 diabetes mellitus (HCC)    on metformin    Past Surgical History:  Procedure Laterality Date  . BREAST BIOPSY Left 2014   neg  . CARDIAC CATHETERIZATION  2010  .  CAROTID PTA/STENT INTERVENTION Right 05/25/2019   Procedure: CAROTID PTA/STENT INTERVENTION;  Surgeon: Annice Needy, MD;  Location: [facility] INVASIVE CV LAB;  Service: Cardiovascular;  Laterality: Right;  . CERVICAL ABLATION       Social History   Tobacco Use  . Smoking status: Former Smoker    Types: Cigarettes    Quit date: 2001    Years since quitting: 21.0  . Smokeless tobacco: Never Used  Vaping Use  . Vaping Use: Never used  Substance Use Topics  . Alcohol use: No  . Drug use: Never      Family History  Problem Relation Age of Onset  . Heart attack Father 18  . Breast cancer Mother 68  . Heart attack Mother      Allergies  Allergen Reactions  . Atenolol Other (See Comments)  . Beta Adrenergic Blockers   . Metformin Other (See Comments)  . Sulfa Antibiotics Nausea  And Vomiting  . Codeine Rash and Other (See Comments)  . Contrast Media [Iodinated Diagnostic Agents] Rash    Other reaction(s): Unknown    REVIEW OF SYSTEMS (Negative unless checked)  Constitutional: [] ?Weight loss  [] ?Fever  [] ?Chills Cardiac: [x] ?Chest pain   [] ?Chest pressure   [] ?Palpitations   [] ?Shortness of breath when laying flat   [] ?Shortness of breath at rest   [x] ?Shortness of breath with exertion. Vascular:  [] ?Pain in legs with walking   [] ?Pain in legs at rest   [] ?Pain in legs when laying flat   [] ?Claudication   [] ?Pain in feet when walking  [] ?Pain in feet at rest  [] ?Pain in feet when laying flat   [] ?History of DVT   [] ?Phlebitis   [] ?Swelling in legs   [] ?Varicose veins   [] ?Non-healing ulcers Pulmonary:   [] ?Uses home oxygen   [] ?Productive cough   [] ?Hemoptysis   [] ?Wheeze  [] ?COPD   [] ?Asthma Neurologic:  [] ?Dizziness  [] ?Blackouts   [] ?Seizures   [x] ?History of stroke   [] ?History of TIA  [] ?Aphasia   [] ?Temporary blindness   [] ?Dysphagia   [] ?Weakness or numbness in arms   [] ?Weakness or numbness in legs Musculoskeletal:  [x] ?Arthritis   [] ?Joint swelling   [x] ?Joint pain   [] ?Low back pain Hematologic:  [] ?Easy bruising  [] ?Easy bleeding   [] ?Hypercoagulable state   [] ?Anemic  [] ?Hepatitis Gastrointestinal:  [] ?Blood in stool   [] ?Vomiting blood  [x] ?Gastroesophageal reflux/heartburn   [] ?Abdominal pain Genitourinary:  [] ?Chronic kidney disease   [] ?Difficult urination  [] ?Frequent urination  [] ?Burning with urination   [] ?Hematuria Skin:  [] ?Rashes   [] ?Ulcers   [] ?Wounds Psychological:  [] ?History of anxiety   [] ? History of major depression.   Physical Examination  BP (!) 142/73 (BP Location: Right Arm)   Pulse 79   Resp 16   Wt 280 lb 9.6 oz (127.3 kg)   BMI 42.67 kg/m  Gen:  WD/WN, NAD Head: [place]/AT, No temporalis wasting. Ear/Nose/Throat: Hearing grossly intact, nares w/o erythema or drainage Eyes: Conjunctiva clear. Sclera non-icteric Neck: Supple.   Trachea midline. Soft right carotid bruit is present Pulmonary:  Good air movement, no use of accessory muscles.  Cardiac: RRR, no JVD Vascular:  Vessel Right Left  Radial Palpable Palpable               Musculoskeletal: M/S 5/5 throughout.  No deformity or atrophy. Mild LE edema. Neurologic: Sensation grossly intact in extremities.  Symmetrical.  Speech is fluent.  Psychiatric: Judgment intact, Mood & affect appropriate for pt's clinical situation. Dermatologic: No rashes or ulcers noted.  No cellulitis or open wounds.       Labs No results found for this or any previous visit (from the past 2160 hour(s)).  Radiology No results found.  Assessment/Plan HYPERTENSION, BENIGN blood pressure control important in reducing the progression of atherosclerotic disease. On appropriate oral medications.   Diabetes (HCC) blood glucose control important in reducing the progression of atherosclerotic disease. Also, involved in wound healing. On appropriate medications.   Hyperlipidemia lipid control important in reducing the progression of atherosclerotic disease. Continue statin therapy  Carotid stenosis  Her carotid duplex today reveals a widely patent right carotid artery stent with no evidence of restenosis.  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  She will continue her aspirin, Plavix, and statin agent.  We will check this again in 6 months and if things are stable, we can then go to a once a year follow-up.    Festus Barren, MD  03/22/2020 2:31 PM    This note was created with Dragon medical transcription system.  Any errors from dictation are purely unintentional

## 2020-04-27 ENCOUNTER — Other Ambulatory Visit (INDEPENDENT_AMBULATORY_CARE_PROVIDER_SITE_OTHER): Payer: Self-pay | Admitting: Vascular Surgery

## 2020-08-12 ENCOUNTER — Other Ambulatory Visit: Payer: Self-pay

## 2020-08-12 ENCOUNTER — Ambulatory Visit (INDEPENDENT_AMBULATORY_CARE_PROVIDER_SITE_OTHER): Payer: Self-pay | Admitting: Vascular Surgery

## 2020-08-12 ENCOUNTER — Encounter (INDEPENDENT_AMBULATORY_CARE_PROVIDER_SITE_OTHER): Payer: Self-pay | Admitting: Vascular Surgery

## 2020-08-12 ENCOUNTER — Ambulatory Visit (INDEPENDENT_AMBULATORY_CARE_PROVIDER_SITE_OTHER): Payer: Self-pay

## 2020-08-12 VITALS — BP 147/83 | HR 76 | Ht 68.0 in | Wt 278.0 lb

## 2020-08-12 DIAGNOSIS — M7989 Other specified soft tissue disorders: Secondary | ICD-10-CM | POA: Insufficient documentation

## 2020-08-12 DIAGNOSIS — E1151 Type 2 diabetes mellitus with diabetic peripheral angiopathy without gangrene: Secondary | ICD-10-CM

## 2020-08-12 DIAGNOSIS — I6523 Occlusion and stenosis of bilateral carotid arteries: Secondary | ICD-10-CM

## 2020-08-12 DIAGNOSIS — I10 Essential (primary) hypertension: Secondary | ICD-10-CM

## 2020-08-12 DIAGNOSIS — E785 Hyperlipidemia, unspecified: Secondary | ICD-10-CM

## 2020-08-12 NOTE — Assessment & Plan Note (Signed)
Duplex today shows the known left carotid artery occlusion.  The right carotid stent is widely patent on duplex although there are mildly elevated velocities likely from compensatory flow with contralateral occlusion.  She is doing well.  Continue aspirin, Plavix, and statin agent.  Recheck in 6 months.

## 2020-08-12 NOTE — Progress Notes (Signed)
MRN : [id number]  Alicia Fischer is a 60 y.o. ([birth year]) female who presents with chief complaint of  Chief Complaint  Patient presents with  . Follow-up    6 mo carotid  . Carotid  .  History of Present Illness: Patient returns in follow-up of her carotid disease.  She is little over a year status post right carotid stent placement for high-grade recurrent stenosis with a known contralateral occlusion.  She is doing well from a neurologic standpoint.  No focal neurologic symptoms. Duplex today shows the known left carotid artery occlusion.  The right carotid stent is widely patent on duplex although there are mildly elevated velocities likely from compensatory flow with contralateral occlusion.  Her biggest complaint today is of worsening swelling and discoloration of the lower legs.  The right leg is more severely affected than the 2 legs.  No clear cause or inciting event.  She is noticing more prominent varicosities as well.  Current Outpatient Medications  Medication Sig Dispense Refill  . acetaminophen (TYLENOL) 325 MG tablet Take 650 mg by mouth daily.     Marland Kitchen amLODipine (NORVASC) 5 MG tablet Take 5 mg by mouth daily.    Marland Kitchen aspirin 81 MG tablet Take 81 mg by mouth daily.    Marland Kitchen atorvastatin (LIPITOR) 80 MG tablet Take 80 mg by mouth daily.    . cetirizine (ZYRTEC) 10 MG tablet Take 10 mg by mouth daily.    . clopidogrel (PLAVIX) 75 MG tablet TAKE 1 TABLET BY MOUTH EVERY DAY 30 tablet 3  . colchicine 0.6 MG tablet Take by mouth.    . famotidine (PEPCID) 20 MG tablet Take 20 mg by mouth 2 (two) times daily.     Marland Kitchen gabapentin (NEURONTIN) 300 MG capsule Take 300 mg by mouth See admin instructions. Take 1 capsule (300mg ) by mouth every the morning and take 2 capsules (600mg ) every night    . hydrochlorothiazide (MICROZIDE) 12.5 MG capsule TAKE (1) CAPSULE DAILY FOR HIGH BLOOD PRESSURE.    losartan (COZAAR) 100 MG tablet Take 100 mg by mouth daily.    . meloxicam (MOBIC) 15 MG tablet  Take 15 mg by mouth daily.    . naproxen (NAPROSYN) 500 MG tablet Take 1 tablet (500 mg total) by mouth 2 (two) times daily with a meal. 30 tablet 0  . nitroGLYCERIN (NITROSTAT) 0.4 MG SL tablet Place 0.4 mg under the tongue every 5 (five) minutes as needed for chest pain.     . traMADol (ULTRAM) 50 MG tablet Take 1 tablet (50 mg total) by mouth every 6 (six) hours as needed. 12 tablet 0  . VOLTAREN 1 % GEL Apply topically.     No current facility-administered medications for this visit.    Past Medical History:  Diagnosis Date  . Chest pain 6/10   probable costochondritits  . CHF (congestive heart failure) (HCC)   . Coronary artery disease   . Diabetes mellitus   . Dyslipidemia   . Dyspnea    Chronic, Echo 7/10: EF 55-60%, no valvular problems  . Enlarged heart   . GERD (gastroesophageal reflux disease)   . History of stroke   . Hyperlipidemia    on Lipitor  . Hypertension    on Lisinopril/ HCTZ  . Obesity   . OSA (obstructive sleep apnea)   . Stroke [facility]) 2002   without any residual deficit  . Type 2 diabetes mellitus (HCC)    on metformin    Past  Surgical History:  Procedure Laterality Date  . BREAST BIOPSY Left 2014   neg  . CARDIAC CATHETERIZATION  2010  . CAROTID PTA/STENT INTERVENTION Right 05/25/2019   Procedure: CAROTID PTA/STENT INTERVENTION;  Surgeon: Annice Needy, MD;  Location: [facility] INVASIVE CV LAB;  Service: Cardiovascular;  Laterality: Right;  . CERVICAL ABLATION       Social History   Tobacco Use  . Smoking status: Former Smoker    Types: Cigarettes    Quit date: 2001    Years since quitting: 21.4  . Smokeless tobacco: Never Used  Vaping Use  . Vaping Use: Never used  Substance Use Topics  . Alcohol use: No  . Drug use: Never       Family History  Problem Relation Age of Onset  . Heart attack Father 31  . Breast cancer Mother 58  . Heart attack Mother      Allergies  Allergen Reactions  . Atenolol Other (See Comments)  . Beta  Adrenergic Blockers   . Metformin Other (See Comments)  . Sulfa Antibiotics Nausea And Vomiting  . Codeine Rash and Other (See Comments)  . Contrast Media [Iodinated Diagnostic Agents] Rash    Other reaction(s): Unknown    REVIEW OF SYSTEMS(Negative unless checked)  Constitutional: [] ??Weight loss[] ??Fever[] ??Chills Cardiac:[x] ??Chest pain[] ??Chest pressure[] ??Palpitations [] ??Shortness of breath when laying flat [] ??Shortness of breath at rest [x] ??Shortness of breath with exertion. Vascular: [] ??Pain in legs with walking[] ??Pain in legsat rest [] ??Pain in legs when laying flat [] ??Claudication [] ??Pain in feet when walking [] ??Pain in feet at rest [] ??Pain in feet when laying flat [] ??History of DVT [] ??Phlebitis [] ??Swelling in legs [] ??Varicose veins [] ??Non-healing ulcers Pulmonary: [] ??Uses home oxygen [] ??Productive cough[] ??Hemoptysis [] ??Wheeze [] ??COPD [] ??Asthma Neurologic: [] ??Dizziness [] ??Blackouts [] ??Seizures [x] ??History of stroke [] ??History of TIA[] ??Aphasia [] ??Temporary blindness[] ??Dysphagia [] ??Weaknessor numbness in arms [] ??Weakness or numbnessin legs Musculoskeletal: [x] ??Arthritis [] ??Joint swelling [x] ??Joint pain [] ??Low back pain Hematologic:[] ??Easy bruising[] ??Easy bleeding [] ??Hypercoagulable state [] ??Anemic [] ??Hepatitis Gastrointestinal:[] ??Blood in stool[] ??Vomiting blood[x] ??Gastroesophageal reflux/heartburn[] ??Abdominal pain Genitourinary: [] ??Chronic kidney disease [] ??Difficulturination [] ??Frequenturination [] ??Burning with urination[] ??Hematuria Skin: [] ??Rashes [] ??Ulcers [] ??Wounds Psychological: [] ??History of anxiety[] ??History of major depression.  Physical Examination  Vitals:   08/12/20 0909  BP: (!) 147/83  Pulse: 76  Weight: 278 lb (126.1 kg)  Height: 5\' 8"  (1.727 m)   Body mass index is 42.27 kg/m. Gen:  WD/WN,  NAD Head: [place]/AT, No temporalis wasting. Ear/Nose/Throat: Hearing grossly intact, nares w/o erythema or drainage, trachea midline Eyes: Conjunctiva clear. Sclera non-icteric Neck: Supple.  Soft right carotid bruit  Pulmonary:  Good air movement, equal and clear to auscultation bilaterally.  Cardiac: RRR, No JVD Vascular:  Vessel Right Left  Radial Palpable Palpable       Musculoskeletal: M/S 5/5 throughout.  No deformity or atrophy. 2+ RLE edema, 1+ LLE edema. Neurologic: CN 2-12 intact. Sensation grossly intact in extremities.  Symmetrical.  Speech is fluent. Motor exam as listed above. Psychiatric: Judgment intact, Mood & affect appropriate for pt's clinical situation. Dermatologic: No rashes or ulcers noted.  No cellulitis or open wounds. Lymph : No Cervical, Axillary, or Inguinal lymphadenopathy.     CBC Lab Results  Component Value Date   WBC 19.3 (H) 05/26/2019   HGB 10.9 (L) 05/26/2019   HCT 33.4 (L) 05/26/2019   MCV 92.0 05/26/2019   PLT 242 05/26/2019    BMET    Component Value Date/Time   NA 135 05/26/2019 1008   NA 139 09/04/2013 1115   K 5.1 05/26/2019 1008   K 3.8 09/04/2013 1115   CL 107 05/26/2019 1008  CL 105 09/04/2013 1115   CO2 19 (L) 05/26/2019 1008   CO2 29 09/04/2013 1115   GLUCOSE 151 (H) 05/26/2019 1008   GLUCOSE 85 09/04/2013 1115   BUN 37 (H) 05/26/2019 1008   BUN 13 09/04/2013 1115   CREATININE 1.89 (H) 05/26/2019 1008   CREATININE 1.16 09/04/2013 1115   CALCIUM 8.6 (L) 05/26/2019 1008   CALCIUM 9.6 09/04/2013 1115   GFRNONAA 28 (L) 05/26/2019 1008   GFRNONAA 53 (L) 09/04/2013 1115   GFRAA 33 (L) 05/26/2019 1008   GFRAA >60 09/04/2013 1115   CrCl cannot be calculated (Patient's most recent lab result is older than the maximum 21 days allowed.).  COAG Lab Results  Component Value Date   INR 1.0 07/26/2011   INR 1.0 07/10/2011    Radiology No results found.   Assessment/Plan HYPERTENSION, BENIGN blood pressure control  important in reducing the progression of atherosclerotic disease. On appropriate oral medications.   Diabetes (HCC) blood glucose control important in reducing the progression of atherosclerotic disease. Also, involved in wound healing. On appropriate medications.   Hyperlipidemia lipid control important in reducing the progression of atherosclerotic disease. Continue statin therapy  Swelling of limb I have had a long discussion with the patient regarding swelling and why it  causes symptoms.  Patient will begin wearing graduated compression stockings class 1 (20-30 mmHg) on a daily basis a prescription was given. The patient will  beginning wearing the stockings first thing in the morning and removing them in the evening. The patient is instructed specifically not to sleep in the stockings.   In addition, behavioral modification will be initiated.  This will include frequent elevation, use of over the counter pain medications and exercise such as walking.  I have reviewed systemic causes for chronic edema such as liver, kidney and cardiac etiologies.  The patient denies problems with these organ systems.    Consideration for a lymph pump will also be made based upon the effectiveness of conservative therapy.  This would help to improve the edema control and prevent sequela such as ulcers and infections   Patient should undergo duplex ultrasound of the venous system to ensure that DVT or reflux is not present.  The patient will follow-up with me after the ultrasound.    Carotid stenosis Duplex today shows the known left carotid artery occlusion.  The right carotid stent is widely patent on duplex although there are mildly elevated velocities likely from compensatory flow with contralateral occlusion.  She is doing well.  Continue aspirin, Plavix, and statin agent.  Recheck in 6 months.    Festus Barren, MD  08/12/2020 9:46 AM    This note was created with Dragon medical  transcription system.  Any errors from dictation are purely unintentional

## 2020-08-12 NOTE — Assessment & Plan Note (Signed)

## 2020-08-16 ENCOUNTER — Ambulatory Visit (INDEPENDENT_AMBULATORY_CARE_PROVIDER_SITE_OTHER): Payer: Disability Insurance | Admitting: Nurse Practitioner

## 2020-08-16 ENCOUNTER — Encounter (INDEPENDENT_AMBULATORY_CARE_PROVIDER_SITE_OTHER): Payer: Self-pay | Admitting: Nurse Practitioner

## 2020-08-16 ENCOUNTER — Other Ambulatory Visit: Payer: Self-pay

## 2020-08-16 ENCOUNTER — Ambulatory Visit (INDEPENDENT_AMBULATORY_CARE_PROVIDER_SITE_OTHER): Payer: Disability Insurance

## 2020-08-16 VITALS — BP 139/79 | HR 66 | Resp 16 | Wt 279.2 lb

## 2020-08-16 DIAGNOSIS — I872 Venous insufficiency (chronic) (peripheral): Secondary | ICD-10-CM

## 2020-08-16 DIAGNOSIS — M7989 Other specified soft tissue disorders: Secondary | ICD-10-CM

## 2020-08-16 DIAGNOSIS — R600 Localized edema: Secondary | ICD-10-CM

## 2020-08-16 DIAGNOSIS — I890 Lymphedema, not elsewhere classified: Secondary | ICD-10-CM

## 2020-08-16 DIAGNOSIS — I6523 Occlusion and stenosis of bilateral carotid arteries: Secondary | ICD-10-CM

## 2020-08-16 DIAGNOSIS — I10 Essential (primary) hypertension: Secondary | ICD-10-CM

## 2020-08-16 DIAGNOSIS — E785 Hyperlipidemia, unspecified: Secondary | ICD-10-CM

## 2020-08-16 NOTE — Progress Notes (Signed)
Subjective:    Patient ID: Alicia Fischer, female    DOB: 1960/07/07, 60 y.o.   MRN: [id number] Chief Complaint  Patient presents with   Follow-up    Ultrasound follow up    Patient is seen for evaluation of leg swelling. The patient first noticed the swelling remotely but is now concerned because of a significant increase in the overall edema. The swelling is associated with pain and discoloration. The patient notes that in the morning the legs are significantly improved but they steadily worsened throughout the course of the day. Elevation makes the legs better, dependency makes them much worse.   There is no history of ulcerations associated with the swelling.   The patient denies any recent changes in their medications.  The patient has not been wearing graduated compression.  The patient has no had any past angiography, interventions or vascular surgery.  The patient denies a history of DVT or PE. There is no prior history of phlebitis. There is no history of primary lymphedema.  There is no history of radiation treatment to the groin or pelvis No history of malignancies. No history of trauma or groin or pelvic surgery. No history of foreign travel or parasitic infections area  Patient also has carotid artery stenosis which was recently checked and flow patterns were stable.  Today noninvasive studies show no evidence of DVT or superficial thrombophlebitis seen bilaterally.  No evidence of deep venous insufficiency seen bilaterally.  There is reflux in the right great saphenous vein at the saphenofemoral junction there is also a large Baker's cyst behind the right knee.   Review of Systems  Cardiovascular:  Positive for leg swelling.  All other systems reviewed and are negative.     Objective:   Physical Exam Vitals reviewed.  HENT:     Head: Normocephalic.  Cardiovascular:     Rate and Rhythm: Normal rate.     Pulses: Normal pulses.  Pulmonary:     Effort:  Pulmonary effort is normal.  Musculoskeletal:     Right lower leg: Edema present.     Left lower leg: Edema present.  Neurological:     Mental Status: She is alert and oriented to person, place, and time.  Psychiatric:        Mood and Affect: Mood normal.        Behavior: Behavior normal.        Thought Content: Thought content normal.        Judgment: Judgment normal.    BP 139/79 (BP Location: Right Arm)   Pulse 66   Resp 16   Wt 279 lb 3.2 oz (126.6 kg)   BMI 42.45 kg/m   Past Medical History:  Diagnosis Date   Chest pain 6/10   probable costochondritits   CHF (congestive heart failure) (HCC)    Coronary artery disease    Diabetes mellitus    Dyslipidemia    Dyspnea    Chronic, Echo 7/10: EF 55-60%, no valvular problems   Enlarged heart    GERD (gastroesophageal reflux disease)    History of stroke    Hyperlipidemia    on Lipitor   Hypertension    on Lisinopril/ HCTZ   Obesity    OSA (obstructive sleep apnea)    Stroke (HCC) 2002   without any residual deficit   Type 2 diabetes mellitus (HCC)    on metformin    Social History   Socioeconomic History   Marital status: Married  Spouse name: Not on file   Number of children: Not on file   Years of education: Not on file   Highest education level: Not on file  Occupational History   Occupation: CNA  Tobacco Use   Smoking status: Former Smoker    Types: Cigarettes    Quit date: 2001    Years since quitting: 21.4   Smokeless tobacco: Never Used  Building services engineer Use: Never used  Substance and Sexual Activity   Alcohol use: No   Drug use: Never   Sexual activity: Not on file  Other Topics Concern   Not on file  Social History Narrative   Lives in [place]   Social Determinants of Health   Financial Resource Strain: Not on file  Food Insecurity: Not on file  Transportation Needs: Not on file  Physical Activity: Not on file  Stress: Not on file  Social Connections: Not on file   Intimate Partner Violence: Not on file    Past Surgical History:  Procedure Laterality Date   BREAST BIOPSY Left 2014   neg   CARDIAC CATHETERIZATION  2010   CAROTID PTA/STENT INTERVENTION Right 05/25/2019   Procedure: CAROTID PTA/STENT INTERVENTION;  Surgeon: Annice Needy, MD;  Location: [facility];  Service: Cardiovascular;  Laterality: Right;   CERVICAL ABLATION      Family History  Problem Relation Age of Onset   Heart attack Father 63   Breast cancer Mother 28   Heart attack Mother     Allergies  Allergen Reactions   Atenolol Other (See Comments)   Beta Adrenergic Blockers    Metformin Other (See Comments)   Sulfa Antibiotics Nausea And Vomiting   Codeine Rash and Other (See Comments)   Contrast Media [Iodinated Diagnostic Agents] Rash    Other reaction(s): Unknown    CBC Latest Ref Rng & Units 05/26/2019 04/21/2017 02/04/2015  WBC 4.0 - 10.5 K/uL 19.3(H) 7.8 11.1(H)  Hemoglobin 12.0 - 15.0 g/dL 10.9(L) 14.3 13.3  Hematocrit 36.0 - 46.0 % 33.4(L) 44.2 40.9  Platelets 150 - 400 K/uL 242 294 298      CMP     Component Value Date/Time   NA 135 05/26/2019 1008   NA 139 09/04/2013 1115   K 5.1 05/26/2019 1008   K 3.8 09/04/2013 1115   CL 107 05/26/2019 1008   CL 105 09/04/2013 1115   CO2 19 (L) 05/26/2019 1008   CO2 29 09/04/2013 1115   GLUCOSE 151 (H) 05/26/2019 1008   GLUCOSE 85 09/04/2013 1115   BUN 37 (H) 05/26/2019 1008   BUN 13 09/04/2013 1115   CREATININE 1.89 (H) 05/26/2019 1008   CREATININE 1.16 09/04/2013 1115   CALCIUM 8.6 (L) 05/26/2019 1008   CALCIUM 9.6 09/04/2013 1115   PROT 7.8 02/04/2015 1658   PROT 8.0 09/04/2013 1115   ALBUMIN 4.0 02/04/2015 1658   ALBUMIN 3.5 09/04/2013 1115   AST 15 02/04/2015 1658   AST 21 09/04/2013 1115   ALT 13 (L) 02/04/2015 1658   ALT 15 09/04/2013 1115   ALKPHOS 79 02/04/2015 1658   ALKPHOS 68 09/04/2013 1115   BILITOT 0.3 02/04/2015 1658   BILITOT 0.3 09/04/2013 1115   GFRNONAA 28 (L)  05/26/2019 1008   GFRNONAA 53 (L) 09/04/2013 1115   GFRAA 33 (L) 05/26/2019 1008   GFRAA >60 09/04/2013 1115     No results found.     Assessment & Plan:   1. Chronic venous insufficiency Patient will  follow conservative therapy as outlined below.  2. Lymphedema I have had a long discussion with the patient regarding swelling and why it  causes symptoms.  Patient will begin wearing graduated compression stockings class 1 (20-30 mmHg) on a daily basis a prescription was given. The patient will  beginning wearing the stockings first thing in the morning and removing them in the evening. The patient is instructed specifically not to sleep in the stockings.   In addition, behavioral modification will be initiated.  This will include frequent elevation, use of over the counter pain medications and exercise such as walking.  I have reviewed systemic causes for chronic edema such as liver, kidney and cardiac etiologies.  The patient denies problems with these organ systems.    Consideration for a lymph pump will also be made based upon the effectiveness of conservative therapy.  This would help to improve the edema control and prevent sequela such as ulcers and infections  Will reevaluate patient's lower extremity edema in 6 months. 3. Hyperlipidemia, unspecified hyperlipidemia type Continue statin as ordered and reviewed, no changes at this time   4. HYPERTENSION, BENIGN Continue antihypertensive medications as already ordered, these medications have been reviewed and there are no changes at this time.   5. Bilateral carotid artery stenosis Recent duplex was about a week ago.  Flow pattern is stable.  Patient will continue with 2-month follow-up Current Outpatient Medications on File Prior to Visit  Medication Sig Dispense Refill   acetaminophen (TYLENOL) 325 MG tablet Take 650 mg by mouth daily.      amLODipine (NORVASC) 5 MG tablet Take 5 mg by mouth daily.     aspirin 81 MG tablet  Take 81 mg by mouth daily.     atorvastatin (LIPITOR) 80 MG tablet Take 80 mg by mouth daily.     cetirizine (ZYRTEC) 10 MG tablet Take 10 mg by mouth daily.     clopidogrel (PLAVIX) 75 MG tablet TAKE 1 TABLET BY MOUTH EVERY DAY 30 tablet 3   colchicine 0.6 MG tablet Take by mouth.     famotidine (PEPCID) 20 MG tablet Take 20 mg by mouth 2 (two) times daily.      gabapentin (NEURONTIN) 300 MG capsule Take 300 mg by mouth See admin instructions. Take 1 capsule (300mg ) by mouth every the morning and take 2 capsules (600mg ) every night     losartan (COZAAR) 50 MG tablet Take 1 tablet by mouth daily.     meloxicam (MOBIC) 15 MG tablet Take 15 mg by mouth daily.     naproxen (NAPROSYN) 500 MG tablet Take 1 tablet (500 mg total) by mouth 2 (two) times daily with a meal. 30 tablet 0   nitroGLYCERIN (NITROSTAT) 0.4 MG SL tablet Place 0.4 mg under the tongue every 5 (five) minutes as needed for chest pain.      traMADol (ULTRAM) 50 MG tablet Take 1 tablet (50 mg total) by mouth every 6 (six) hours as needed. 12 tablet 0   VOLTAREN 1 % GEL Apply topically.     hydrochlorothiazide (MICROZIDE) 12.5 MG capsule TAKE (1) CAPSULE DAILY FOR HIGH BLOOD PRESSURE. (Patient not taking: Reported on 08/16/2020)     losartan (COZAAR) 100 MG tablet Take 100 mg by mouth daily. (Patient not taking: Reported on 08/16/2020)     No current facility-administered medications on file prior to visit.    There are no Patient Instructions on file for this visit. No follow-ups on file.   10/16/2020  Owens Shark, NP

## 2021-01-23 ENCOUNTER — Other Ambulatory Visit (INDEPENDENT_AMBULATORY_CARE_PROVIDER_SITE_OTHER): Payer: Self-pay | Admitting: Vascular Surgery

## 2021-02-10 ENCOUNTER — Encounter (INDEPENDENT_AMBULATORY_CARE_PROVIDER_SITE_OTHER): Payer: Self-pay | Admitting: Vascular Surgery

## 2021-02-10 ENCOUNTER — Ambulatory Visit (INDEPENDENT_AMBULATORY_CARE_PROVIDER_SITE_OTHER): Payer: Medicare HMO | Admitting: Vascular Surgery

## 2021-02-10 ENCOUNTER — Ambulatory Visit (INDEPENDENT_AMBULATORY_CARE_PROVIDER_SITE_OTHER): Payer: Medicare HMO

## 2021-02-10 ENCOUNTER — Other Ambulatory Visit: Payer: Self-pay

## 2021-02-10 VITALS — BP 148/76 | HR 86 | Ht 67.0 in | Wt 270.0 lb

## 2021-02-10 DIAGNOSIS — I10 Essential (primary) hypertension: Secondary | ICD-10-CM | POA: Diagnosis not present

## 2021-02-10 DIAGNOSIS — E785 Hyperlipidemia, unspecified: Secondary | ICD-10-CM | POA: Diagnosis not present

## 2021-02-10 DIAGNOSIS — E1151 Type 2 diabetes mellitus with diabetic peripheral angiopathy without gangrene: Secondary | ICD-10-CM | POA: Diagnosis not present

## 2021-02-10 DIAGNOSIS — I6523 Occlusion and stenosis of bilateral carotid arteries: Secondary | ICD-10-CM | POA: Diagnosis not present

## 2021-02-10 NOTE — Progress Notes (Signed)
MRN : [id number]  Alicia Fischer is a 60 y.o. (16-Jun-1960) female who presents with chief complaint of  Chief Complaint  Patient presents with   Follow-up    33Mo Carotid  .  History of Present Illness: Patient returns in follow-up of her carotid disease.  She is doing well today.  She did not have any focal neurologic symptoms or other problems since her last visit.  She is status post right carotid stent placement previously.  Her duplex today shows this to be widely patent without significant recurrent stenosis and a known left carotid occlusion.  Current Outpatient Medications  Medication Sig Dispense Refill   acetaminophen (TYLENOL) 325 MG tablet Take 650 mg by mouth daily.      amLODipine (NORVASC) 5 MG tablet Take 5 mg by mouth daily.     aspirin 81 MG tablet Take 81 mg by mouth daily.     atorvastatin (LIPITOR) 80 MG tablet Take 80 mg by mouth daily.     cetirizine (ZYRTEC) 10 MG tablet Take 10 mg by mouth daily.     clopidogrel (PLAVIX) 75 MG tablet TAKE 1 TABLET BY MOUTH EVERY DAY 30 tablet 3   colchicine 0.6 MG tablet Take by mouth.     famotidine (PEPCID) 20 MG tablet Take 20 mg by mouth 2 (two) times daily.      gabapentin (NEURONTIN) 300 MG capsule Take 300 mg by mouth See admin instructions. Take 1 capsule (300mg ) by mouth every the morning and take 2 capsules (600mg ) every night     hydrochlorothiazide (MICROZIDE) 12.5 MG capsule      losartan (COZAAR) 100 MG tablet Take 100 mg by mouth daily.     losartan (COZAAR) 50 MG tablet Take 1 tablet by mouth daily.     meloxicam (MOBIC) 15 MG tablet Take 15 mg by mouth daily.     naproxen (NAPROSYN) 500 MG tablet Take 1 tablet (500 mg total) by mouth 2 (two) times daily with a meal. 30 tablet 0   nitroGLYCERIN (NITROSTAT) 0.4 MG SL tablet Place 0.4 mg under the tongue every 5 (five) minutes as needed for chest pain.      traMADol (ULTRAM) 50 MG tablet Take 1 tablet (50 mg total) by mouth every 6 (six) hours as needed. 12  tablet 0   VOLTAREN 1 % GEL Apply topically.     No current facility-administered medications for this visit.    Past Medical History:  Diagnosis Date   Chest pain 6/10   probable costochondritits   CHF (congestive heart failure) ([place])    Coronary artery disease    Diabetes mellitus    Dyslipidemia    Dyspnea    Chronic, Echo 7/10: EF 55-60%, no valvular problems   Enlarged heart    GERD (gastroesophageal reflux disease)    History of stroke    Hyperlipidemia    on Lipitor   Hypertension    on Lisinopril/ HCTZ   Obesity    OSA (obstructive sleep apnea)    Stroke ([place]) 2002   without any residual deficit   Type 2 diabetes mellitus ([facility])    on metformin    Past Surgical History:  Procedure Laterality Date   BREAST BIOPSY Left 2014   neg   CARDIAC CATHETERIZATION  2010   CAROTID PTA/STENT INTERVENTION Right 05/25/2019   Procedure: CAROTID PTA/STENT INTERVENTION;  Surgeon: Algernon Huxley, MD;  Location: [facility];  Service: Cardiovascular;  Laterality: Right;   CERVICAL  ABLATION       Social History   Tobacco Use   Smoking status: Former    Types: Cigarettes    Quit date: 2001    Years since quitting: 21.9   Smokeless tobacco: Never  Vaping Use   Vaping Use: Never used  Substance Use Topics   Alcohol use: No   Drug use: Never       Family History  Problem Relation Age of Onset   Heart attack Father 33   Breast cancer Mother 72   Heart attack Mother      Allergies  Allergen Reactions   Atenolol Other (See Comments)   Beta Adrenergic Blockers    Metformin Other (See Comments)   Sulfa Antibiotics Nausea And Vomiting   Codeine Rash and Other (See Comments)   Contrast Media [Iodinated Diagnostic Agents] Rash    Other reaction(s): Unknown     REVIEW OF SYSTEMS (Negative unless checked)   Constitutional: [] Weight loss  [] Fever  [] Chills Cardiac: [x] Chest pain   [] Chest pressure   [] Palpitations   [] Shortness of breath when laying flat    [] Shortness of breath at rest   [x] Shortness of breath with exertion. Vascular:  [] Pain in legs with walking   [] Pain in legs at rest   [] Pain in legs when laying flat   [] Claudication   [] Pain in feet when walking  [] Pain in feet at rest  [] Pain in feet when laying flat   [] History of DVT   [] Phlebitis   [] Swelling in legs   [] Varicose veins   [] Non-healing ulcers Pulmonary:   [] Uses home oxygen   [] Productive cough   [] Hemoptysis   [] Wheeze  [] COPD   [] Asthma Neurologic:  [] Dizziness  [] Blackouts   [] Seizures   [x] History of stroke   [] History of TIA  [] Aphasia   [] Temporary blindness   [] Dysphagia   [] Weakness or numbness in arms   [] Weakness or numbness in legs Musculoskeletal:  [x] Arthritis   [] Joint swelling   [x] Joint pain   [] Low back pain Hematologic:  [] Easy bruising  [] Easy bleeding   [] Hypercoagulable state   [] Anemic  [] Hepatitis Gastrointestinal:  [] Blood in stool   [] Vomiting blood  [x] Gastroesophageal reflux/heartburn   [] Abdominal pain Genitourinary:  [] Chronic kidney disease   [] Difficult urination  [] Frequent urination  [] Burning with urination   [] Hematuria Skin:  [] Rashes   [] Ulcers   [] Wounds Psychological:  [] History of anxiety   []  History of major depression.  Physical Examination  Vitals:   02/10/21 1110  BP: (!) 148/76  Pulse: 86  Weight: 270 lb (122.5 kg)  Height: 5\' 7"  (1.702 m)   Body mass index is 42.29 kg/m. Gen:  WD/WN, NAD Head: [place]/AT, No temporalis wasting. Ear/Nose/Throat: Hearing grossly intact, nares w/o erythema or drainage, trachea midline Eyes: Conjunctiva clear. Sclera non-icteric Neck: Supple.  No bruit  Pulmonary:  Good air movement, equal and clear to auscultation bilaterally.  Cardiac: RRR, No JVD Vascular:  Vessel Right Left  Radial Palpable Palpable       Musculoskeletal: M/S 5/5 throughout.  No deformity or atrophy. Mild LE edema. Neurologic: CN 2-12 intact. Sensation grossly intact in extremities.  Symmetrical.  Speech is fluent.  Motor exam as listed above. Psychiatric: Judgment intact, Mood & affect appropriate for pt's clinical situation. Dermatologic: No rashes or ulcers noted.  No cellulitis or open wounds.     CBC Lab Results  Component Value Date   WBC 19.3 (H) 05/26/2019   HGB 10.9 (L) 05/26/2019   HCT 33.4 (  L) 05/26/2019   MCV 92.0 05/26/2019   PLT 242 05/26/2019    BMET    Component Value Date/Time   NA 135 05/26/2019 1008   NA 139 09/04/2013 1115   K 5.1 05/26/2019 1008   K 3.8 09/04/2013 1115   CL 107 05/26/2019 1008   CL 105 09/04/2013 1115   CO2 19 (L) 05/26/2019 1008   CO2 29 09/04/2013 1115   GLUCOSE 151 (H) 05/26/2019 1008   GLUCOSE 85 09/04/2013 1115   BUN 37 (H) 05/26/2019 1008   BUN 13 09/04/2013 1115   CREATININE 1.89 (H) 05/26/2019 1008   CREATININE 1.16 09/04/2013 1115   CALCIUM 8.6 (L) 05/26/2019 1008   CALCIUM 9.6 09/04/2013 1115   GFRNONAA 28 (L) 05/26/2019 1008   GFRNONAA 53 (L) 09/04/2013 1115   GFRAA 33 (L) 05/26/2019 1008   GFRAA >60 09/04/2013 1115   CrCl cannot be calculated (Patient's most recent lab result is older than the maximum 21 days allowed.).  COAG Lab Results  Component Value Date   INR 1.0 07/26/2011   INR 1.0 07/10/2011    Radiology No results found.   Assessment/Plan HYPERTENSION, BENIGN blood pressure control important in reducing the progression of atherosclerotic disease. On appropriate oral medications.     Diabetes ([place]) blood glucose control important in reducing the progression of atherosclerotic disease. Also, involved in wound healing. On appropriate medications.     Hyperlipidemia lipid control important in reducing the progression of atherosclerotic disease. Continue statin therapy   Carotid stenosis Duplex today shows the known left carotid artery occlusion.  The right carotid stent is widely patent on duplex with contralateral occlusion.  She is doing well.  Continue aspirin, Plavix, and statin agent.  Recheck in 6  months.    Leotis Pain, MD  02/10/2021 1:53 PM    This note was created with Dragon medical transcription system.  Any errors from dictation are purely unintentional

## 2021-04-07 ENCOUNTER — Encounter: Payer: Self-pay | Admitting: Emergency Medicine

## 2021-04-07 ENCOUNTER — Ambulatory Visit
Admission: EM | Admit: 2021-04-07 | Discharge: 2021-04-07 | Disposition: A | Discharge status: Home / Self Care | Payer: Medicare HMO | Attending: Medical Oncology | Admitting: Medical Oncology

## 2021-04-07 ENCOUNTER — Other Ambulatory Visit: Payer: Self-pay

## 2021-04-07 DIAGNOSIS — R051 Acute cough: Secondary | ICD-10-CM

## 2021-04-07 DIAGNOSIS — R0981 Nasal congestion: Secondary | ICD-10-CM | POA: Diagnosis not present

## 2021-04-07 MED ORDER — FLUTICASONE PROPIONATE 50 MCG/ACT NA SUSP: 2.0000 | Freq: Every day | NASAL | 0 refills | Status: AC

## 2021-04-07 MED ORDER — BENZONATATE 100 MG PO CAPS: 100.0000 mg | ORAL_CAPSULE | Freq: Three times a day (TID) | ORAL | 0 refills | Status: DC

## 2021-04-07 NOTE — ED Provider Notes (Signed)
[facility]    CSN: [id number] Arrival date & time: 04/07/21  [id number]      History   Chief Complaint Chief Complaint  Patient presents with   Cough   Nasal Congestion    HPI Alicia Fischer is a 61 y.o. female.   HPI  Cold Symptoms: Patient reports that they have had symptoms of dry cough, nasal congestion, post nasal drainage, chills for the past 7 days. Symptoms are slightly improved as her specialist saw her yesterday and gave her an inhaler. They deny SOB, chest pain, fever or vomiting. They have tried nothing else for symptoms. No known sick contacts.    Past Medical History:  Diagnosis Date   Chest pain 6/10   probable costochondritits   CHF (congestive heart failure) ([place])    Coronary artery disease    Diabetes mellitus    Dyslipidemia    Dyspnea    Chronic, Echo 7/10: EF 55-60%, no valvular problems   Enlarged heart    GERD (gastroesophageal reflux disease)    History of stroke    Hyperlipidemia    on Lipitor   Hypertension    on Lisinopril/ HCTZ   Obesity    OSA (obstructive sleep apnea)    Stroke ([place]) 2002   without any residual deficit   Type 2 diabetes mellitus ([place])    on metformin    Patient Active Problem List   Diagnosis Date Noted   Swelling of limb 08/12/2020   Carotid stenosis 04/14/2019   Abnormal results of liver function studies 09/14/2013   Abnormal mammogram 09/14/2013   Bunion 09/14/2013   Disorder of kidney and ureter 09/14/2013   Edema 09/14/2013   Generalized osteoarthritis of multiple sites 09/14/2013   Morbid obesity ([place]) 09/14/2013   Gout, unspecified 09/14/2013   Obstructive sleep apnea 09/14/2013   Pain in joint involving ankle and foot 09/14/2013   Cramp of limb 09/14/2013   Restless legs syndrome (RLS) 09/14/2013   Diabetes ([place]) 09/14/2013   SHORTNESS OF BREATH 09/15/2008   Hyperlipidemia 09/11/2008   HYPERTENSION, BENIGN 09/11/2008   CHEST PAIN-UNSPECIFIED 09/11/2008    Past Surgical History:   Procedure Laterality Date   BREAST BIOPSY Left 2014   neg   CARDIAC CATHETERIZATION  2010   CAROTID PTA/STENT INTERVENTION Right 05/25/2019   Procedure: CAROTID PTA/STENT INTERVENTION;  Surgeon: Algernon Huxley, MD;  Location: [facility] [facility];  Service: Cardiovascular;  Laterality: Right;   CERVICAL ABLATION      OB History   No obstetric history on file.      Home Medications    Prior to Admission medications   Medication Sig Start Date End Date Taking? Authorizing Provider  amLODipine (NORVASC) 5 MG tablet Take 5 mg by mouth daily.   Yes [provider]  aspirin 81 MG tablet Take 81 mg by mouth daily.   Yes [provider]  atorvastatin (LIPITOR) 80 MG tablet Take 80 mg by mouth daily.   Yes [provider]  cetirizine (ZYRTEC) 10 MG tablet Take 10 mg by mouth daily.   Yes [provider]  clopidogrel (PLAVIX) 75 MG tablet TAKE 1 TABLET BY MOUTH EVERY DAY 01/23/21  Yes Dew, Erskine Squibb, MD  hydrochlorothiazide (MICROZIDE) 12.5 MG capsule  03/18/20  Yes [provider]  losartan (COZAAR) 50 MG tablet Take 1 tablet by mouth daily. 08/15/20  Yes [provider]  acetaminophen (TYLENOL) 325 MG tablet Take 650 mg by mouth daily.     [provider]  colchicine 0.6 MG tablet Take by mouth. 05/02/20   [provider]  famotidine (PEPCID) 20 MG tablet Take 20 mg by mouth 2 (two) times daily.     [provider]  gabapentin (NEURONTIN) 300 MG capsule Take 300 mg by mouth See admin instructions. Take 1 capsule (300mg ) by mouth every the morning and take 2 capsules (600mg ) every night 04/25/18   [provider]  losartan (COZAAR) 100 MG tablet Take 100 mg by mouth daily. 08/05/18   [provider]  meloxicam (MOBIC) 15 MG tablet Take 15 mg by mouth daily. 09/24/18   [provider]  naproxen (NAPROSYN) 500 MG tablet Take 1 tablet (500 mg total) by mouth 2 (two) times daily with a meal. 07/29/19    Triplett, Cari B, FNP  nitroGLYCERIN (NITROSTAT) 0.4 MG SL tablet Place 0.4 mg under the tongue every 5 (five) minutes as needed for chest pain.     [provider]  traMADol (ULTRAM) 50 MG tablet Take 1 tablet (50 mg total) by mouth every 6 (six) hours as needed. 07/29/19   Triplett, Cari B, FNP  VOLTAREN 1 % GEL Apply topically. 05/02/20   [provider]    Family History Family History  Problem Relation Age of Onset   Heart attack Father 61   Breast cancer Mother 28   Heart attack Mother     Social History Social History   Tobacco Use   Smoking status: Former    Types: Cigarettes    Quit date: 2001    Years since quitting: 22.0   Smokeless tobacco: Never  Vaping Use   Vaping Use: Never used  Substance Use Topics   Alcohol use: No   Drug use: Never     Allergies   Atenolol, Beta adrenergic blockers, Metformin, Sulfa antibiotics, Codeine, and Contrast media [iodinated contrast media]   Review of Systems Review of Systems  As stated above in HPI Physical Exam Triage Vital Signs ED Triage Vitals  Enc Vitals Group     BP 04/07/21 0921 (!) 135/58     Pulse Rate 04/07/21 0921 83     Resp 04/07/21 0921 14     Temp 04/07/21 0921 98 F (36.7 C)     Temp Source 04/07/21 0921 Oral     SpO2 04/07/21 0921 97 %     Weight 04/07/21 0918 275 lb (124.7 kg)     Height 04/07/21 0918 5' 7.5" (1.715 m)     Head Circumference --      Peak Flow --      Pain Score 04/07/21 0918 8     Pain Loc --      Pain Edu? --      Excl. in [place]? --    No data found.  Updated Vital Signs BP (!) 135/58 (BP Location: Right Arm)    Pulse 83    Temp 98 F (36.7 C) (Oral)    Resp 14    Ht 5' 7.5" (1.715 m)    Wt 275 lb (124.7 kg)    SpO2 97%    BMI 42.44 kg/m   Physical Exam Vitals and nursing note reviewed.  Constitutional:      General: She is not in acute distress.    Appearance: Normal appearance. She is not ill-appearing, toxic-appearing or diaphoretic.  HENT:      Head: Normocephalic and atraumatic.     Right Ear: Tympanic membrane normal.     Left Ear: Tympanic  membrane normal.     Nose: Congestion (No sinus bogginess. Mild maxillary tenderness of sinuses which does not corelate with examination findings) present. No rhinorrhea.     Mouth/Throat:     Mouth: Mucous membranes are moist.     Pharynx: Oropharynx is clear. No oropharyngeal exudate or posterior oropharyngeal erythema.  Eyes:     General:        Right eye: No discharge.        Left eye: No discharge.     Extraocular Movements: Extraocular movements intact.     Conjunctiva/sclera: Conjunctivae normal.     Pupils: Pupils are equal, round, and reactive to light.  Cardiovascular:     Rate and Rhythm: Normal rate and regular rhythm.     Heart sounds: Normal heart sounds.  Pulmonary:     Effort: Pulmonary effort is normal. No respiratory distress.     Breath sounds: Normal breath sounds. No stridor. No wheezing or rhonchi.  Abdominal:     Palpations: Abdomen is soft.  Musculoskeletal:     Cervical back: Normal range of motion and neck supple.  Lymphadenopathy:     Cervical: No cervical adenopathy.  Skin:    General: Skin is warm.     Coloration: Skin is not jaundiced.     Findings: No erythema or rash.  Neurological:     Mental Status: She is alert and oriented to person, place, and time.     UC Treatments / Results  Labs (all labs ordered are listed, but only abnormal results are displayed) Labs Reviewed - No data to display  EKG   Radiology No results found.  Procedures Procedures (including critical care time)  Medications Ordered in UC Medications - No data to display  Initial Impression / Assessment and Plan / UC Course  I have reviewed the triage vital signs and the nursing notes.  Pertinent labs & imaging results that were available during my care of the patient were reviewed by me and considered in my medical decision making (see chart for details).      New. Discussed that this appears viral in nature- treating her sinus inflammation with flonase, tessalon, sinus rinses. Discussed red flag signs and symptoms. Follow up PRN.  Final Clinical Impressions(s) / UC Diagnoses   Final diagnoses:  None   Discharge Instructions   None    ED Prescriptions   None    PDMP not reviewed this encounter.   Hughie Closs, [place] 04/07/21 [phone]

## 2021-04-07 NOTE — ED Triage Notes (Signed)
Patient c/o nasal congestion, cough, and chills that started a week ago.  Patient states that she started having left sided sinus pressure and pain for the past 2 days.  Patient states that she saw her PCP yesterday and was given an inhaler because she was wheezing.  Patient denies fevers.

## 2021-04-22 ENCOUNTER — Other Ambulatory Visit (INDEPENDENT_AMBULATORY_CARE_PROVIDER_SITE_OTHER): Payer: Self-pay | Admitting: Vascular Surgery

## 2021-05-18 IMAGING — MG DIGITAL SCREENING BILAT W/ TOMO W/ CAD
6 of 12 series · 6 of 36 positions shown · non-contrast
Comparison: Previous exam(s).

CLINICAL DATA: Screening.

EXAM:
DIGITAL SCREENING BILATERAL MAMMOGRAM WITH TOMO AND CAD

[L MLO synth-2D]
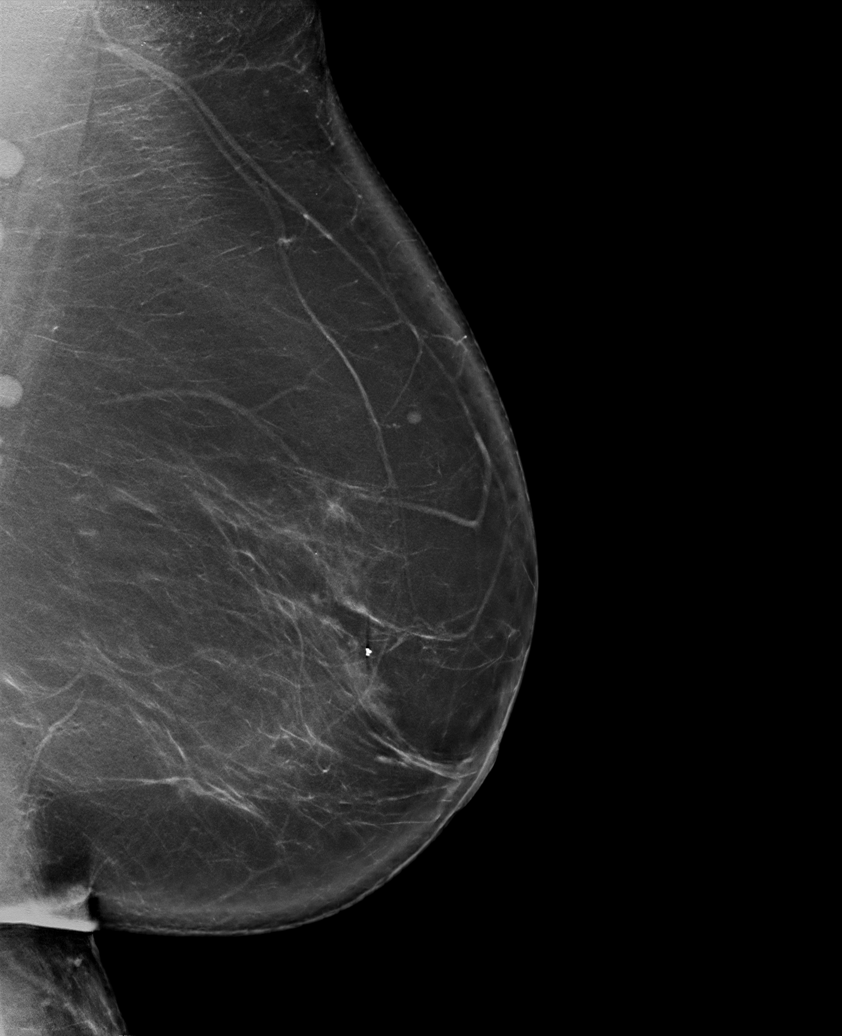

[R CC synth-2D]
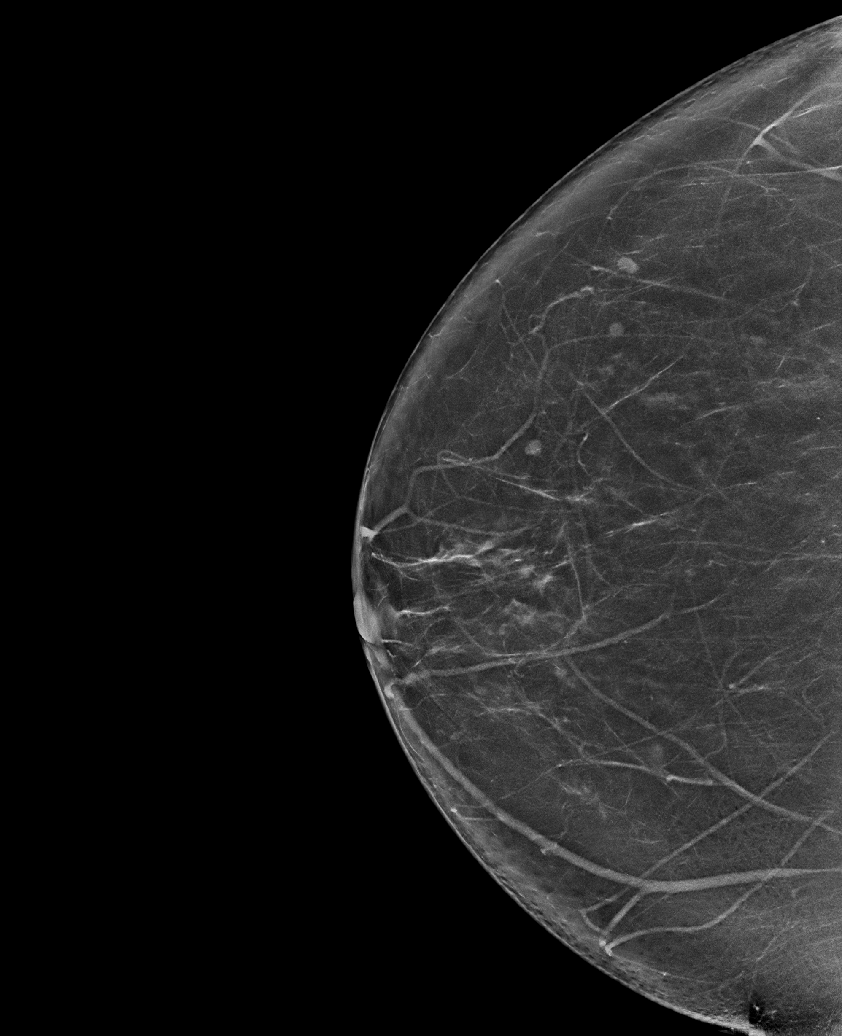

[R MLO synth-2D]
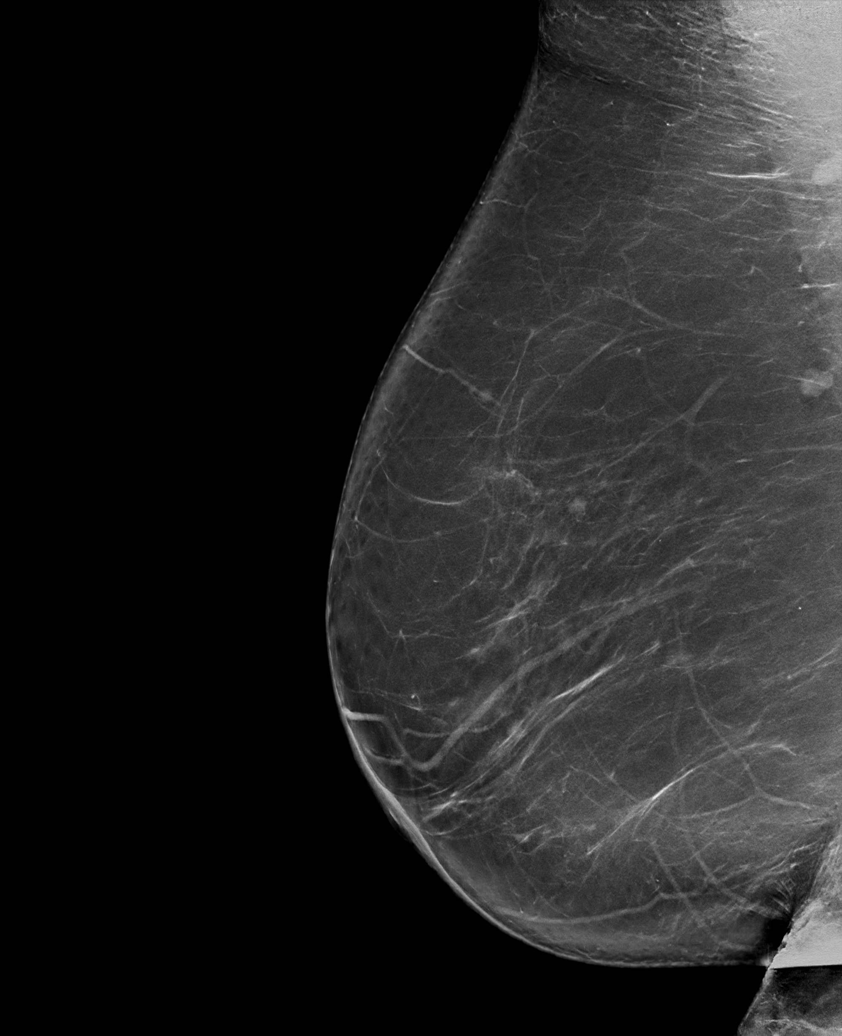

[R AT synth-2D]
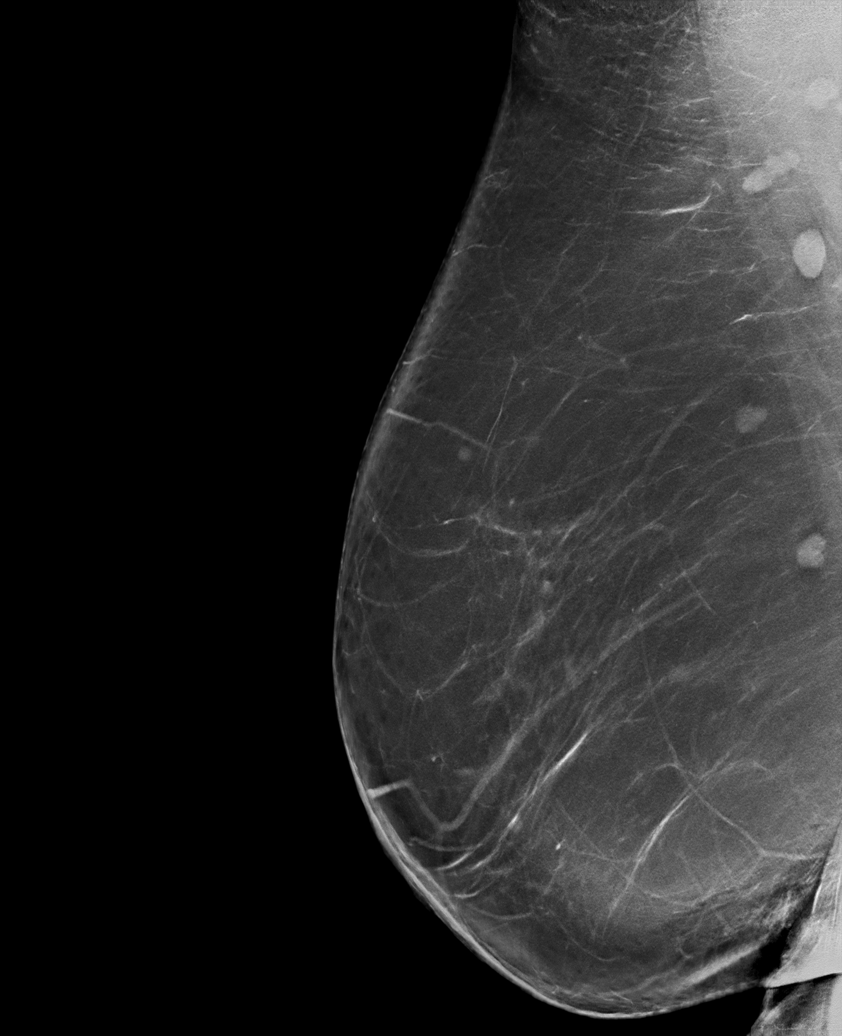

[R CV synth-2D]
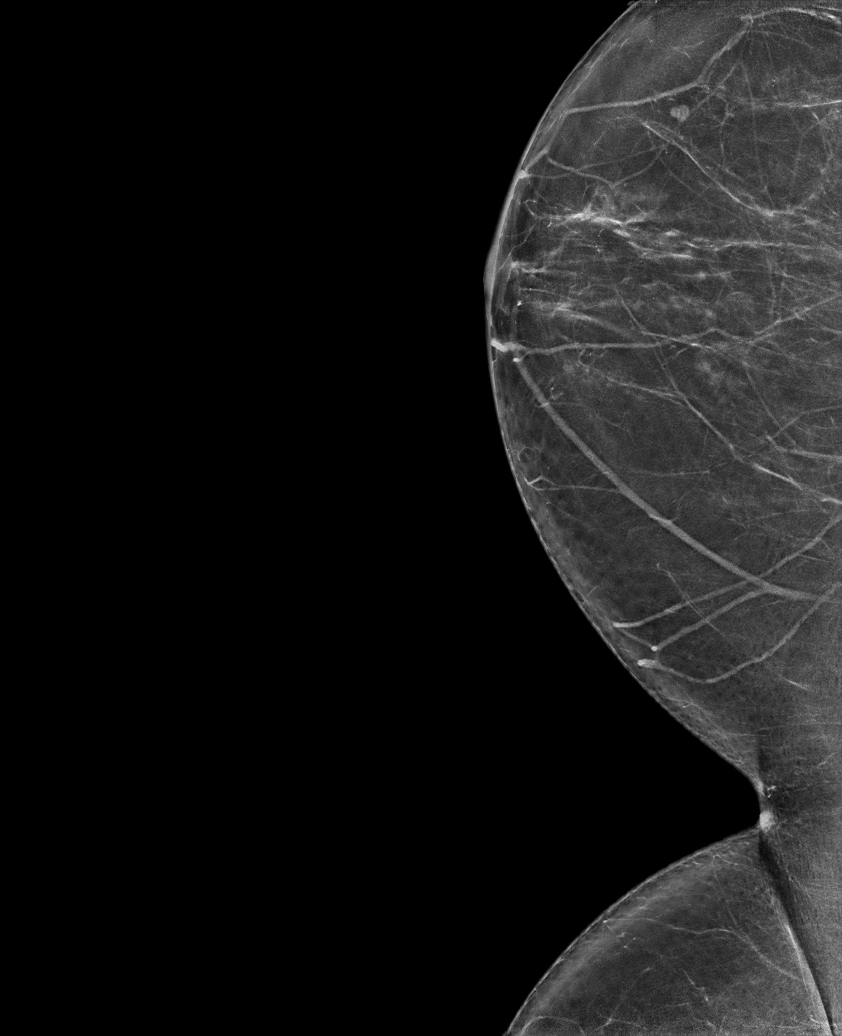

[L CC synth-2D]
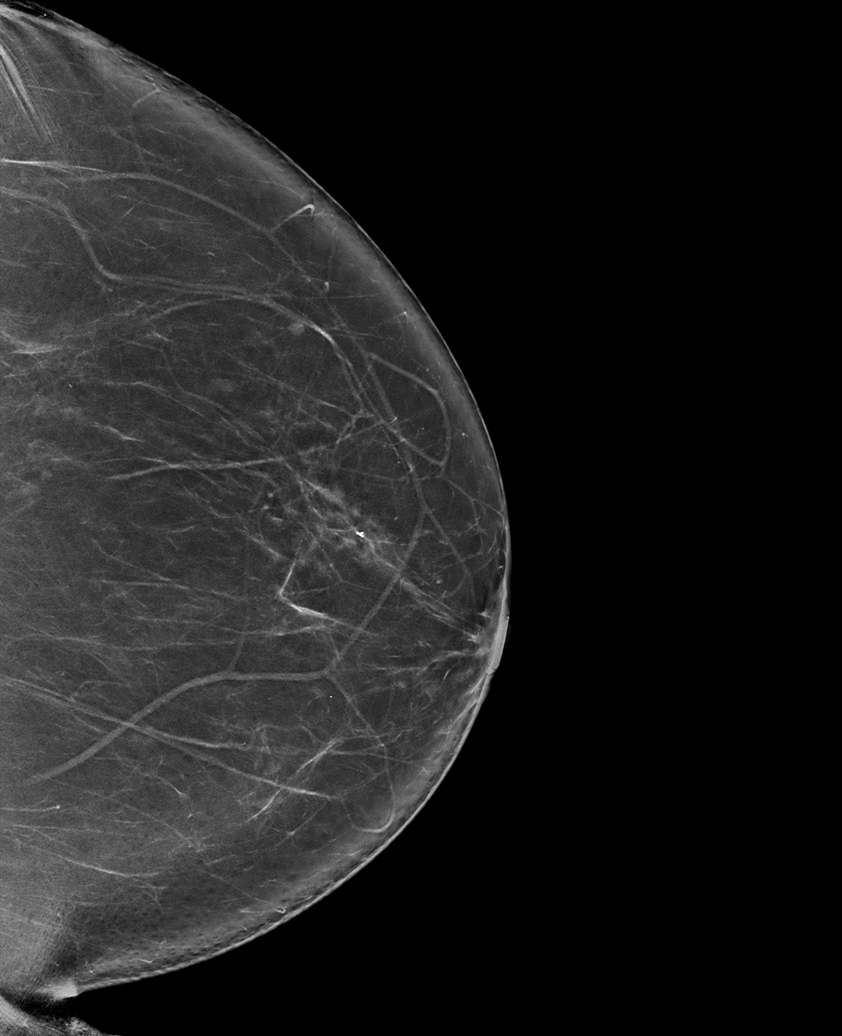

[6 of 36 positions shown; findings below may reference images not displayed]

ACR Breast Density Category b: There are scattered areas of
fibroglandular density.
FINDINGS: There are no findings suspicious for malignancy. Images were
processed with CAD.
IMPRESSION: No mammographic evidence of malignancy. A result letter of this
screening mammogram will be mailed directly to the patient.

RECOMMENDATION:
Screening mammogram in one year. (Code:CN-[id number])

BI-RADS CATEGORY  1: Negative.

## 2021-06-01 ENCOUNTER — Ambulatory Visit (INDEPENDENT_AMBULATORY_CARE_PROVIDER_SITE_OTHER): Payer: Medicare HMO

## 2021-06-01 ENCOUNTER — Encounter: Payer: Self-pay | Admitting: Emergency Medicine

## 2021-06-01 ENCOUNTER — Ambulatory Visit
Admission: EM | Admit: 2021-06-01 | Discharge: 2021-06-01 | Disposition: A | Discharge status: Home / Self Care | Payer: Medicare HMO | Attending: Internal Medicine | Admitting: Internal Medicine

## 2021-06-01 ENCOUNTER — Other Ambulatory Visit: Payer: Self-pay

## 2021-06-01 DIAGNOSIS — R059 Cough, unspecified: Secondary | ICD-10-CM | POA: Diagnosis not present

## 2021-06-01 DIAGNOSIS — J45901 Unspecified asthma with (acute) exacerbation: Secondary | ICD-10-CM

## 2021-06-01 DIAGNOSIS — R0602 Shortness of breath: Secondary | ICD-10-CM | POA: Diagnosis not present

## 2021-06-01 DIAGNOSIS — M549 Dorsalgia, unspecified: Secondary | ICD-10-CM | POA: Diagnosis not present

## 2021-06-01 MED ORDER — FEXOFENADINE HCL 180 MG PO TABS: 180.0000 mg | ORAL_TABLET | Freq: Every day | ORAL | 0 refills | Status: AC

## 2021-06-01 MED ORDER — PREDNISONE 10 MG PO TABS: 20.0000 mg | ORAL_TABLET | Freq: Every day | ORAL | 0 refills | Status: DC

## 2021-06-01 NOTE — Discharge Instructions (Addendum)
Switch to allegra for a month, then you may go back to your Zyrtec  ?

## 2021-06-01 NOTE — ED Triage Notes (Signed)
Pt c/o cough, shortness of breath, upper back pain, rib pain, post nasal drip. Started about a week ago. Pt states her o2 has been low at home. She states it normally runs about 98%. Pt does not appear to be in distress during triage and O2 level 98% during triage.  ?

## 2021-06-01 NOTE — ED Provider Notes (Addendum)
[facility]    CSN: [id number] Arrival date & time: 06/01/21  1010      History   Chief Complaint Chief Complaint  Patient presents with   Cough   Shortness of Breath    HPI Alicia Fischer is a 61 y.o. female who presents with PND, cough some sneezing x 1 week. Since yesterday noticed SOB with activity and her pulse ox dropping to 93% while sitting. Has been having pain on her ribs, and upper back bilaterally and hurts to cough. Has been using her Ventolin inhaler that was prescribed by her PCP. Last dose 6 h ago. Denies fever. While sitting here denies SOB. Has sleep apnea and does not have a C-pap, but now that she has medicare she may be able to have this covered. Has not had more edema than her usual.     Past Medical History:  Diagnosis Date   Chest pain 6/10   probable costochondritits   CHF (congestive heart failure) ([facility])    Coronary artery disease    Diabetes mellitus    Dyslipidemia    Dyspnea    Chronic, Echo 7/10: EF 55-60%, no valvular problems   Enlarged heart    GERD (gastroesophageal reflux disease)    History of stroke    Hyperlipidemia    on Lipitor   Hypertension    on Lisinopril/ HCTZ   Obesity    OSA (obstructive sleep apnea)    Stroke ([facility]) 2002   without any residual deficit   Type 2 diabetes mellitus ([facility])    on metformin    Patient Active Problem List   Diagnosis Date Noted   Swelling of limb 08/12/2020   Carotid stenosis 04/14/2019   Abnormal results of liver function studies 09/14/2013   Abnormal mammogram 09/14/2013   Bunion 09/14/2013   Disorder of kidney and ureter 09/14/2013   Edema 09/14/2013   Generalized osteoarthritis of multiple sites 09/14/2013   Morbid obesity ([facility]) 09/14/2013   Gout, unspecified 09/14/2013   Obstructive sleep apnea 09/14/2013   Pain in joint involving ankle and foot 09/14/2013   Cramp of limb 09/14/2013   Restless legs syndrome (RLS) 09/14/2013   Diabetes ([facility]) 09/14/2013   SHORTNESS OF  BREATH 09/15/2008   Hyperlipidemia 09/11/2008   HYPERTENSION, BENIGN 09/11/2008   CHEST PAIN-UNSPECIFIED 09/11/2008    Past Surgical History:  Procedure Laterality Date   BREAST BIOPSY Left 2014   neg   CARDIAC CATHETERIZATION  2010   CAROTID PTA/STENT INTERVENTION Right 05/25/2019   Procedure: CAROTID PTA/STENT INTERVENTION;  Surgeon: Annice Needy, MD;  Location: [facility];  Service: Cardiovascular;  Laterality: Right;   CERVICAL ABLATION      OB History   No obstetric history on file.      Home Medications    Prior to Admission medications   Medication Sig Start Date End Date Taking? Authorizing Provider  acetaminophen (TYLENOL) 325 MG tablet Take 650 mg by mouth daily.    Yes [provider]  amLODipine (NORVASC) 5 MG tablet Take 5 mg by mouth daily.   Yes [provider]  aspirin 81 MG tablet Take 81 mg by mouth daily.   Yes [provider]  atorvastatin (LIPITOR) 80 MG tablet Take 80 mg by mouth daily.   Yes [provider]  cetirizine (ZYRTEC) 10 MG tablet Take 10 mg by mouth daily.   Yes [provider]  clopidogrel (PLAVIX) 75 MG tablet TAKE 1 TABLET BY MOUTH EVERY DAY  04/24/21  Yes Annice Needy, MD  colchicine 0.6 MG tablet Take by mouth. 05/02/20  Yes [provider]  famotidine (PEPCID) 20 MG tablet Take 20 mg by mouth 2 (two) times daily.    Yes [provider]  fexofenadine (ALLEGRA ALLERGY) 180 MG tablet Take 1 tablet (180 mg total) by mouth daily. 06/01/21  Yes Rodriguez-Southworth, Nettie Elm, PA-C  fluticasone (FLONASE) 50 MCG/ACT nasal spray Place 2 sprays into both nostrils daily. 04/07/21  Yes Covington, Sarah M, PA-C  gabapentin (NEURONTIN) 300 MG capsule Take 300 mg by mouth See admin instructions. Take 1 capsule (300mg ) by mouth every the morning and take 2 capsules (600mg ) every night 04/25/18  Yes [provider]  predniSONE (DELTASONE) 10 MG tablet Take 2 tablets (20 mg total) by  mouth daily. 06/01/21  Yes Rodriguez-Southworth, Nettie Elm, PA-C  VOLTAREN 1 % GEL Apply topically. 05/02/20  Yes [provider]  nitroGLYCERIN (NITROSTAT) 0.4 MG SL tablet Place 0.4 mg under the tongue every 5 (five) minutes as needed for chest pain.     [provider]    Family History Family History  Problem Relation Age of Onset   Heart attack Father 44   Breast cancer Mother 56   Heart attack Mother     Social History Social History   Tobacco Use   Smoking status: Former    Types: Cigarettes    Quit date: 2001    Years since quitting: 22.2   Smokeless tobacco: Never  Vaping Use   Vaping Use: Never used  Substance Use Topics   Alcohol use: No   Drug use: Never     Allergies   Atenolol, Beta adrenergic blockers, Metformin, Sulfa antibiotics, Codeine, and Contrast media [iodinated contrast media]   Review of Systems Review of Systems  Constitutional:  Negative for appetite change, chills, diaphoresis and fever.  HENT:  Positive for congestion and sneezing. Negative for ear discharge and ear pain.   Respiratory:  Positive for cough, shortness of breath and wheezing. Negative for chest tightness.   Cardiovascular:  Negative for chest pain.  Musculoskeletal:  Negative for myalgias.  Skin:  Negative for rash.  Neurological:  Negative for headaches.    Physical Exam Triage Vital Signs ED Triage Vitals  Enc Vitals Group     BP 06/01/21 1116 (!) 145/62     Pulse Rate 06/01/21 1116 73     Resp 06/01/21 1116 18     Temp 06/01/21 1116 98.2 F (36.8 C)     Temp Source 06/01/21 1116 Oral     SpO2 06/01/21 1116 98 %     Weight 06/01/21 1114 274 lb 14.6 oz (124.7 kg)     Height 06/01/21 1114 5' 7.5" (1.715 m)     Head Circumference --      Peak Flow --      Pain Score 06/01/21 1112 7     Pain Loc --      Pain Edu? --      Excl. in [place]? --    No data found.  Updated Vital Signs BP (!) 145/62 (BP Location: Left Arm)   Pulse 73   Temp 98.2 F (36.8  C) (Oral)   Resp 18   Ht 5' 7.5" (1.715 m)   Wt 274 lb 14.6 oz (124.7 kg)   SpO2 98%   BMI 42.42 kg/m   Visual Acuity Right Eye Distance:   Left Eye Distance:   Bilateral Distance:    Right Eye Near:  Left Eye Near:    Bilateral Near:      Physical Exam Vitals signs and nursing note reviewed.  Constitutional:      General: She is not in acute distress.    Appearance: Normal appearance. She is not ill-appearing, toxic-appearing or diaphoretic.  HENT:     Head: Normocephalic.     Right Ear: Tympanic membrane, ear canal and external ear normal.     Left Ear: Tympanic membrane, ear canal and external ear normal.     Nose: Nose normal.     Mouth/Throat:     Mouth: Mucous membranes are moist.  Eyes:     General: No scleral icterus.       Right eye: No discharge.        Left eye: No discharge.     Conjunctiva/sclera: Conjunctivae normal.  Neck:     Musculoskeletal: Neck supple. No neck rigidity.  Cardiovascular:     Rate and Rhythm: Normal rate and regular rhythm.     Heart sounds: No murmur. +1/4 pitting edema of ankles noted.  Pulmonary:     Effort: Pulmonary effort is normal.     Breath sounds: Normal breath sounds.  Musculoskeletal: Normal range of motion.  Lymphadenopathy:     Cervical: No cervical adenopathy.  Skin:    General: Skin is warm and dry.     Coloration: Skin is not jaundiced.     Findings: No rash.  Neurological:     Mental Status: She is alert and oriented to person, place, and time.     Gait: Gait normal.  Psychiatric:        Mood and Affect: Mood normal.        Behavior: Behavior normal.        Thought Content: Thought content normal.        Judgment: Judgment normal.   I did a walking pulse ox check and it only dropped to 97% UC Treatments / Results  Labs (all labs ordered are listed, but only abnormal results are displayed) Labs Reviewed - No data to display  EKG   Radiology DG Chest 2 View  Result Date: 06/01/2021 CLINICAL  DATA:  Cough, upper back pain, shortness of breath, rib pain EXAM: CHEST - 2 VIEW COMPARISON:  Chest radiograph 04/21/2017 FINDINGS: The heart is mildly enlarged, unchanged. The upper mediastinal contours are normal. There is no focal consolidation or pulmonary edema. There is no pleural effusion or pneumothorax. There is no acute osseous abnormality. IMPRESSION: Mild cardiomegaly. No radiographic evidence of acute cardiopulmonary process. Electronically Signed   By: Lesia Hausen M.D.   On: 06/01/2021 12:11    Procedures Procedures (including critical care time)  Medications Ordered in UC Medications - No data to display  Initial Impression / Assessment and Plan / UC Course  I have reviewed the triage vital signs and the nursing notes.  Pertinent  imaging results that were available during my care of the patient were reviewed by me and considered in my medical decision making (see chart for details).  I placed her on Prednisone and switched her allergy med to Allegra for a month.     Final Clinical Impressions(s) / UC Diagnoses   Final diagnoses:  Exacerbation of asthma, unspecified asthma severity, unspecified whether persistent     Discharge Instructions      Switch to allegra for a month, then you may go back to your Zyrtec      ED Prescriptions     Medication Sig Dispense  Auth. Provider   fexofenadine (ALLEGRA ALLERGY) 180 MG tablet Take 1 tablet (180 mg total) by mouth daily. 30 tablet Rodriguez-Southworth, Nettie Elm, PA-C   predniSONE (DELTASONE) 10 MG tablet Take 2 tablets (20 mg total) by mouth daily. 10 tablet Rodriguez-Southworth, Nettie Elm, PA-C      PDMP not reviewed this encounter.   Garey Ham, PA-C 06/01/21 1253    Rodriguez-Southworth, [place], PA-C 06/01/21 1255

## 2021-06-22 ENCOUNTER — Other Ambulatory Visit: Payer: Self-pay | Admitting: Family Medicine

## 2021-06-22 ENCOUNTER — Encounter: Payer: Self-pay | Admitting: Nurse Practitioner

## 2021-06-22 DIAGNOSIS — Z1231 Encounter for screening mammogram for malignant neoplasm of breast: Secondary | ICD-10-CM

## 2021-07-26 ENCOUNTER — Ambulatory Visit
Admission: RE | Admit: 2021-07-26 | Discharge: 2021-07-26 | Disposition: A | Discharge status: Home / Self Care | Payer: Medicare HMO | Source: Ambulatory Visit | Attending: Family Medicine | Admitting: Family Medicine

## 2021-07-26 DIAGNOSIS — Z1231 Encounter for screening mammogram for malignant neoplasm of breast: Secondary | ICD-10-CM | POA: Diagnosis not present

## 2021-08-31 ENCOUNTER — Institutional Professional Consult (permissible substitution): Payer: Medicare HMO | Admitting: Pulmonary Disease

## 2021-10-12 ENCOUNTER — Other Ambulatory Visit (INDEPENDENT_AMBULATORY_CARE_PROVIDER_SITE_OTHER): Payer: Self-pay | Admitting: Vascular Surgery

## 2021-10-12 ENCOUNTER — Ambulatory Visit
Admission: EM | Admit: 2021-10-12 | Discharge: 2021-10-12 | Disposition: A | Discharge status: Home / Self Care | Payer: Medicare HMO | Attending: Family Medicine | Admitting: Family Medicine

## 2021-10-12 DIAGNOSIS — M109 Gout, unspecified: Secondary | ICD-10-CM

## 2021-10-12 MED ORDER — COLCHICINE 0.6 MG PO TABS: 0.6000 mg | ORAL_TABLET | Freq: Every day | ORAL | 0 refills | Status: AC

## 2021-10-12 NOTE — Discharge Instructions (Addendum)
You have gout. See attached handout to learn more.  Tablet 2 tablets today and then 1 hour later take an additional tablet. Thereafter, take 1 tablet daily for the rest of this week. Stop by the pharmacy to pick up your prescriptions.  Follow up with your primary care provider as needed.

## 2021-10-12 NOTE — ED Provider Notes (Signed)
[facility]    CSN: [id number] Arrival date & time: 10/12/21  1006      History   Chief Complaint Chief Complaint  Patient presents with   Foot Pain    Left     HPI Alicia Fischer is a 61 y.o. female.   HPI  Patient was sleeping on Monday and woke up with bad pain in her left foot.  Denies previous or current injury to this foot.  She tried some topical creams for pain but these have not helped.  She has been using a cane to help her walk.  Has history of gout.  Endorses some swelling to her left big toe and pain when she moves it.  She has not stepped on anything or rolled her ankle.  She has no other symptoms including fever, rash, vomiting, diarrhea, right leg pain.  She denies drinking alcohol recently.  She has been eating seafood and some red meat.  She no longer takes allopurinol.  Past Medical History:  Diagnosis Date   Chest pain 6/10   probable costochondritits   CHF (congestive heart failure) (HCC)    Coronary artery disease    Diabetes mellitus    Dyslipidemia    Dyspnea    Chronic, Echo 7/10: EF 55-60%, no valvular problems   Enlarged heart    GERD (gastroesophageal reflux disease)    History of stroke    Hyperlipidemia    on Lipitor   Hypertension    on Lisinopril/ HCTZ   Obesity    OSA (obstructive sleep apnea)    Stroke (HCC) 2002   without any residual deficit   Type 2 diabetes mellitus (HCC)    on metformin    Patient Active Problem List   Diagnosis Date Noted   Swelling of limb 08/12/2020   Carotid stenosis 04/14/2019   Abnormal results of liver function studies 09/14/2013   Abnormal mammogram 09/14/2013   Bunion 09/14/2013   Disorder of kidney and ureter 09/14/2013   Edema 09/14/2013   Generalized osteoarthritis of multiple sites 09/14/2013   Morbid obesity (HCC) 09/14/2013   Gout, unspecified 09/14/2013   Obstructive sleep apnea 09/14/2013   Pain in joint involving ankle and foot 09/14/2013   Cramp of limb 09/14/2013    Restless legs syndrome (RLS) 09/14/2013   Diabetes (HCC) 09/14/2013   SHORTNESS OF BREATH 09/15/2008   Hyperlipidemia 09/11/2008   HYPERTENSION, BENIGN 09/11/2008   CHEST PAIN-UNSPECIFIED 09/11/2008    Past Surgical History:  Procedure Laterality Date   BREAST BIOPSY Left 2014   neg   CARDIAC CATHETERIZATION  2010   CAROTID PTA/STENT INTERVENTION Right 05/25/2019   Procedure: CAROTID PTA/STENT INTERVENTION;  Surgeon: Annice Needy, MD;  Location: [facility];  Service: Cardiovascular;  Laterality: Right;   CERVICAL ABLATION      OB History   No obstetric history on file.      Home Medications    Prior to Admission medications   Medication Sig Start Date End Date Taking? Authorizing Provider  acetaminophen (TYLENOL) 325 MG tablet Take 650 mg by mouth daily.     [provider]  amLODipine (NORVASC) 5 MG tablet Take 5 mg by mouth daily.    [provider]  aspirin 81 MG tablet Take 81 mg by mouth daily.    [provider]  atorvastatin (LIPITOR) 80 MG tablet Take 80 mg by mouth daily.    [provider]  cetirizine (ZYRTEC) 10 MG tablet Take 10 mg  by mouth daily.    [provider]  clopidogrel (PLAVIX) 75 MG tablet TAKE 1 TABLET BY MOUTH EVERY DAY 04/24/21   Annice Needy, MD  colchicine 0.6 MG tablet Take 1 tablet (0.6 mg total) by mouth daily. Then take 2 tablets then 1 hour later take an additional tablet. Thereafter, take 1 tablet daily for 1 week. 10/12/21  Yes Loanne Emery, DO  famotidine (PEPCID) 20 MG tablet Take 20 mg by mouth 2 (two) times daily.     [provider]  fexofenadine (ALLEGRA ALLERGY) 180 MG tablet Take 1 tablet (180 mg total) by mouth daily. 06/01/21   Rodriguez-Southworth, Nettie Elm, PA-C  fluticasone (FLONASE) 50 MCG/ACT nasal spray Place 2 sprays into both nostrils daily. 04/07/21   Rushie Chestnut, PA-C  gabapentin (NEURONTIN) 300 MG capsule Take 300 mg by mouth See admin instructions. Take 1  capsule (300mg ) by mouth every the morning and take 2 capsules (600mg ) every night 04/25/18   [provider]  nitroGLYCERIN (NITROSTAT) 0.4 MG SL tablet Place 0.4 mg under the tongue every 5 (five) minutes as needed for chest pain.     [provider]  VOLTAREN 1 % GEL Apply topically. 05/02/20   [provider]    Family History Family History  Problem Relation Age of Onset   Heart attack Father 5   Breast cancer Mother 55   Heart attack Mother     Social History Social History   Tobacco Use   Smoking status: Former    Types: Cigarettes    Quit date: 2001    Years since quitting: 22.6   Smokeless tobacco: Never  Vaping Use   Vaping Use: Never used  Substance Use Topics   Alcohol use: No   Drug use: Never     Allergies   Atenolol, Beta adrenergic blockers, Metformin, Sulfa antibiotics, Codeine, and Contrast media [iodinated contrast media]   Review of Systems Review of Systems :negative unless otherwise stated in HPI.      Physical Exam Triage Vital Signs ED Triage Vitals  Enc Vitals Group     BP 10/12/21 1032 (!) 154/78     Pulse Rate 10/12/21 1032 67     Resp --      Temp 10/12/21 1032 98 F (36.7 C)     Temp Source 10/12/21 1032 Oral     SpO2 10/12/21 1032 95 %     Weight 10/12/21 1030 272 lb (123.4 kg)     Height 10/12/21 1030 5\' 8"  (1.727 m)     Head Circumference --      Peak Flow --      Pain Score 10/12/21 1030 9     Pain Loc --      Pain Edu? --      Excl. in GC? --    No data found.  Updated Vital Signs BP (!) 154/78 (BP Location: Left Arm)   Pulse 67   Temp 98 F (36.7 C) (Oral)   Ht 5\' 8"  (1.727 m)   Wt 123.4 kg   SpO2 95%   BMI 41.36 kg/m   Visual Acuity Right Eye Distance:   Left Eye Distance:   Bilateral Distance:    Right Eye Near:   Left Eye Near:    Bilateral Near:     Physical Exam GEN: well appearing female in no acute distress  CVS: well perfused  RESP: speaking in full sentences  without pause, no respiratory distress  MSK Left ankle: Inspection:  mild erythema and edema, no ecchymosis or bony deformity Palpation: Tenderness to left great toe to light palpation ROM: Full active and passive range of motion Strength: 5/5 in all directions No ligamentous laxity No pain at the base of the fifth metatarsal Able to ambulate Special Tests: negative anterior and posterior drawer Neurovascularly intact, no instability noted   Skin: Erythematous, edematous and tender to light palpation consistent with gout UC Treatments / Results  Labs (all labs ordered are listed, but only abnormal results are displayed) Labs Reviewed - No data to display  EKG   Radiology No results found.  Procedures Procedures (including critical care time)  Medications Ordered in UC Medications - No data to display  Initial Impression / Assessment and Plan / UC Course  I have reviewed the triage vital signs and the nursing notes.  Pertinent labs & imaging results that were available during my care of the patient were reviewed by me and considered in my medical decision making (see chart for details).    Acute on chronic GOUT  Patient is a 61 year old female with history of gout who presents with acute insidious onset of ankle pain.  Exam is concerning for gouty arthritis.  She is no longer taking her allopurinol.  Treat with colchicine.  Patient to follow-up with her PCP to discuss prophylactic treatment after she her acute episode has resolved.   Discussed MDM, treatment plan and plan for follow-up with patient who agrees with plan.    Final Clinical Impressions(s) / UC Diagnoses   Final diagnoses:  Acute gout involving toe of left foot, unspecified cause     Discharge Instructions      You have gout. See attached handout to learn more.  Tablet 2 tablets today and then 1 hour later take an additional tablet. Thereafter, take 1 tablet daily for the rest of this week. Stop by the  pharmacy to pick up your prescriptions.  Follow up with your primary care provider as needed.     ED Prescriptions     Medication Sig Dispense Auth. Provider   colchicine 0.6 MG tablet Take 1 tablet (0.6 mg total) by mouth daily. Then take 2 tablets then 1 hour later take an additional tablet. Thereafter, take 1 tablet daily for 1 week. 9 tablet Sten Dematteo, Seward Meth, DO      PDMP not reviewed this encounter.   Katha Cabal, DO 10/12/21 1110

## 2021-10-12 NOTE — ED Triage Notes (Signed)
Patient reports that "something is wrong with my left foot"  Patient reports pain started Monday -- it woke her up.   Patient states she has tried some topical creams and gels on it but it has not helped.   Patient denies any injury to the left foot.   Patient reports that she does have a past history of gait.

## 2022-02-15 ENCOUNTER — Other Ambulatory Visit (INDEPENDENT_AMBULATORY_CARE_PROVIDER_SITE_OTHER): Payer: Self-pay | Admitting: Vascular Surgery

## 2022-02-15 DIAGNOSIS — I6523 Occlusion and stenosis of bilateral carotid arteries: Secondary | ICD-10-CM

## 2022-02-16 ENCOUNTER — Ambulatory Visit (INDEPENDENT_AMBULATORY_CARE_PROVIDER_SITE_OTHER): Payer: Medicare HMO

## 2022-02-16 ENCOUNTER — Encounter (INDEPENDENT_AMBULATORY_CARE_PROVIDER_SITE_OTHER): Payer: Self-pay | Admitting: Vascular Surgery

## 2022-02-16 ENCOUNTER — Ambulatory Visit (INDEPENDENT_AMBULATORY_CARE_PROVIDER_SITE_OTHER): Payer: Medicare HMO | Admitting: Vascular Surgery

## 2022-02-16 VITALS — BP 151/82 | HR 72 | Resp 16 | Wt 275.0 lb

## 2022-02-16 DIAGNOSIS — I10 Essential (primary) hypertension: Secondary | ICD-10-CM

## 2022-02-16 DIAGNOSIS — E1151 Type 2 diabetes mellitus with diabetic peripheral angiopathy without gangrene: Secondary | ICD-10-CM

## 2022-02-16 DIAGNOSIS — I6523 Occlusion and stenosis of bilateral carotid arteries: Secondary | ICD-10-CM | POA: Diagnosis not present

## 2022-02-16 DIAGNOSIS — E785 Hyperlipidemia, unspecified: Secondary | ICD-10-CM | POA: Diagnosis not present

## 2022-02-16 NOTE — Progress Notes (Signed)
MRN : [id number]  Alicia Fischer is a 61 y.o. (04/23/1960) female who presents with chief complaint of  Chief Complaint  Patient presents with   Follow-up    Ultrasound follow up  .  History of Present Illness: Patient returns in follow-up of her carotid disease.  She is almost 3 years status post right carotid stent placement for high-grade stenosis with contralateral occlusion.  She is doing well.  No focal neurologic symptoms or other complaints. Specifically, the patient denies amaurosis fugax, speech or swallowing difficulties, or arm or leg weakness or numbness.  Carotid duplex today demonstrates a widely patent right carotid stent and a known left carotid artery occlusion.  Current Outpatient Medications  Medication Sig Dispense Refill   acetaminophen (TYLENOL) 325 MG tablet Take 650 mg by mouth daily.      aspirin 81 MG tablet Take 81 mg by mouth daily.     atorvastatin (LIPITOR) 80 MG tablet Take 80 mg by mouth daily.     cetirizine (ZYRTEC) 10 MG tablet Take 10 mg by mouth daily.     clopidogrel (PLAVIX) 75 MG tablet TAKE 1 TABLET BY MOUTH EVERY DAY 90 tablet 1   colchicine 0.6 MG tablet Take 1 tablet (0.6 mg total) by mouth daily. Then take 2 tablets then 1 hour later take an additional tablet. Thereafter, take 1 tablet daily for 1 week. 9 tablet 0   famotidine (PEPCID) 20 MG tablet Take 20 mg by mouth 2 (two) times daily.      fexofenadine (ALLEGRA ALLERGY) 180 MG tablet Take 1 tablet (180 mg total) by mouth daily. 30 tablet 0   fluticasone (FLONASE) 50 MCG/ACT nasal spray Place 2 sprays into both nostrils daily. 16 mL 0   gabapentin (NEURONTIN) 300 MG capsule Take 300 mg by mouth See admin instructions. Take 1 capsule (300mg ) by mouth every the morning and take 2 capsules (600mg ) every night     losartan (COZAAR) 100 MG tablet Take 100 mg by mouth daily.     nitroGLYCERIN (NITROSTAT) 0.4 MG SL tablet Place 0.4 mg under the tongue every 5 (five) minutes as needed for chest  pain.      VOLTAREN 1 % GEL Apply topically.     amLODipine (NORVASC) 5 MG tablet Take 5 mg by mouth daily. (Patient not taking: Reported on 02/16/2022)     No current facility-administered medications for this visit.    Past Medical History:  Diagnosis Date   Chest pain 6/10   probable costochondritits   CHF (congestive heart failure) (HCC)    Coronary artery disease    Diabetes mellitus    Dyslipidemia    Dyspnea    Chronic, Echo 7/10: EF 55-60%, no valvular problems   Enlarged heart    GERD (gastroesophageal reflux disease)    History of stroke    Hyperlipidemia    on Lipitor   Hypertension    on Lisinopril/ HCTZ   Obesity    OSA (obstructive sleep apnea)    Stroke (HCC) 2002   without any residual deficit   Type 2 diabetes mellitus (HCC)    on metformin    Past Surgical History:  Procedure Laterality Date   BREAST BIOPSY Left 2014   neg   CARDIAC CATHETERIZATION  2010   CAROTID PTA/STENT INTERVENTION Right 05/25/2019   Procedure: CAROTID PTA/STENT INTERVENTION;  Surgeon: 2011, MD;  Location: [facility];  Service: Cardiovascular;  Laterality: Right;   CERVICAL ABLATION  Social History   Tobacco Use   Smoking status: Former    Types: Cigarettes    Quit date: 2001    Years since quitting: 22.9   Smokeless tobacco: Never  Vaping Use   Vaping Use: Never used  Substance Use Topics   Alcohol use: No   Drug use: Never      Family History  Problem Relation Age of Onset   Heart attack Father 58   Breast cancer Mother 65   Heart attack Mother      Allergies  Allergen Reactions   Atenolol Other (See Comments)   Beta Adrenergic Blockers    Metformin Other (See Comments)   Sulfa Antibiotics Nausea And Vomiting   Codeine Rash and Other (See Comments)   Contrast Media [Iodinated Contrast Media] Rash    Other reaction(s): Unknown    REVIEW OF SYSTEMS (Negative unless checked)   Constitutional: [] Weight loss  [] Fever   [] Chills Cardiac: [x] Chest pain   [] Chest pressure   [] Palpitations   [] Shortness of breath when laying flat   [] Shortness of breath at rest   [x] Shortness of breath with exertion. Vascular:  [] Pain in legs with walking   [] Pain in legs at rest   [] Pain in legs when laying flat   [] Claudication   [] Pain in feet when walking  [] Pain in feet at rest  [] Pain in feet when laying flat   [] History of DVT   [] Phlebitis   [] Swelling in legs   [] Varicose veins   [] Non-healing ulcers Pulmonary:   [] Uses home oxygen   [] Productive cough   [] Hemoptysis   [] Wheeze  [] COPD   [] Asthma Neurologic:  [] Dizziness  [] Blackouts   [] Seizures   [x] History of stroke   [] History of TIA  [] Aphasia   [] Temporary blindness   [] Dysphagia   [] Weakness or numbness in arms   [] Weakness or numbness in legs Musculoskeletal:  [x] Arthritis   [] Joint swelling   [x] Joint pain   [] Low back pain Hematologic:  [] Easy bruising  [] Easy bleeding   [] Hypercoagulable state   [] Anemic  [] Hepatitis Gastrointestinal:  [] Blood in stool   [] Vomiting blood  [x] Gastroesophageal reflux/heartburn   [] Abdominal pain Genitourinary:  [] Chronic kidney disease   [] Difficult urination  [] Frequent urination  [] Burning with urination   [] Hematuria Skin:  [] Rashes   [] Ulcers   [] Wounds Psychological:  [] History of anxiety   []  History of major depression.  Physical Examination  Vitals:   02/16/22 0916  BP: (!) 151/82  Pulse: 72  Resp: 16  Weight: 275 lb (124.7 kg)   Body mass index is 41.81 kg/m. Gen:  WD/WN, NAD. obese Head: [place]/AT, No temporalis wasting. Ear/Nose/Throat: Hearing grossly intact, nares w/o erythema or drainage, trachea midline Eyes: Conjunctiva clear. Sclera non-icteric Neck: Supple.  No bruit  Pulmonary:  Good air movement, equal and clear to auscultation bilaterally.  Cardiac: RRR, No JVD Vascular:  Vessel Right Left  Radial Palpable Palpable           Musculoskeletal: M/S 5/5 throughout.  No deformity or atrophy. No  edema. Neurologic: CN 2-12 intact. Sensation grossly intact in extremities.  Symmetrical.  Speech is fluent. Motor exam as listed above. Psychiatric: Judgment intact, Mood & affect appropriate for pt's clinical situation. Dermatologic: No rashes or ulcers noted.  No cellulitis or open wounds.     CBC Lab Results  Component Value Date   WBC 19.3 (H) 05/26/2019   HGB 10.9 (L) 05/26/2019   HCT 33.4 (L) 05/26/2019   MCV 92.0 05/26/2019  PLT 242 05/26/2019    BMET    Component Value Date/Time   NA 135 05/26/2019 1008   NA 139 09/04/2013 1115   K 5.1 05/26/2019 1008   K 3.8 09/04/2013 1115   CL 107 05/26/2019 1008   CL 105 09/04/2013 1115   CO2 19 (L) 05/26/2019 1008   CO2 29 09/04/2013 1115   GLUCOSE 151 (H) 05/26/2019 1008   GLUCOSE 85 09/04/2013 1115   BUN 37 (H) 05/26/2019 1008   BUN 13 09/04/2013 1115   CREATININE 1.89 (H) 05/26/2019 1008   CREATININE 1.16 09/04/2013 1115   CALCIUM 8.6 (L) 05/26/2019 1008   CALCIUM 9.6 09/04/2013 1115   GFRNONAA 28 (L) 05/26/2019 1008   GFRNONAA 53 (L) 09/04/2013 1115   GFRAA 33 (L) 05/26/2019 1008   GFRAA >60 09/04/2013 1115   CrCl cannot be calculated (Patient's most recent lab result is older than the maximum 21 days allowed.).  COAG Lab Results  Component Value Date   INR 1.0 07/26/2011   INR 1.0 07/10/2011    Radiology No results found.   Assessment/Plan HYPERTENSION, BENIGN blood pressure control important in reducing the progression of atherosclerotic disease. On appropriate oral medications.     Diabetes (HCC) blood glucose control important in reducing the progression of atherosclerotic disease. Also, involved in wound healing. On appropriate medications.     Hyperlipidemia lipid control important in reducing the progression of atherosclerotic disease. Continue statin therapy   Carotid stenosis Carotid duplex today demonstrates a widely patent right carotid stent and a known left carotid artery occlusion.   Doing well.  No change in medical regimen.  Recheck in 1 year.    Festus Barren, MD  02/16/2022 10:09 AM    This note was created with Dragon medical transcription system.  Any errors from dictation are purely unintentional

## 2022-02-16 NOTE — Assessment & Plan Note (Signed)
Carotid duplex today demonstrates a widely patent right carotid stent and a known left carotid artery occlusion.  Doing well.  No change in medical regimen.  Recheck in 1 year.

## 2022-03-29 ENCOUNTER — Ambulatory Visit
Admission: EM | Admit: 2022-03-29 | Discharge: 2022-03-29 | Disposition: A | Discharge status: Home / Self Care | Payer: Medicare HMO | Attending: Emergency Medicine | Admitting: Emergency Medicine

## 2022-03-29 DIAGNOSIS — G4762 Sleep related leg cramps: Secondary | ICD-10-CM | POA: Diagnosis not present

## 2022-03-29 LAB — COMPREHENSIVE METABOLIC PANEL
ALT: 15 U/L (ref 0–44)
AST: 21 U/L (ref 15–41)
Albumin: 4.1 g/dL (ref 3.5–5.0)
Alkaline Phosphatase: 84 U/L (ref 38–126)
Anion gap: 9 (ref 5–15)
BUN: 28 mg/dL — ABNORMAL HIGH (ref 8–23)
CO2: 26 mmol/L (ref 22–32)
Calcium: 9.2 mg/dL (ref 8.9–10.3)
Chloride: 98 mmol/L (ref 98–111)
Creatinine, Ser: 1.2 mg/dL — ABNORMAL HIGH (ref 0.44–1.00)
GFR, Estimated: 51 mL/min — ABNORMAL LOW (ref >60)
Glucose, Bld: 103 mg/dL — ABNORMAL HIGH (ref 70–99)
Potassium: 5.1 mmol/L (ref 3.5–5.1)
Sodium: 133 mmol/L — ABNORMAL LOW (ref 135–145)
Total Bilirubin: 0.9 mg/dL (ref 0.3–1.2)
Total Protein: 8.6 g/dL — ABNORMAL HIGH (ref 6.5–8.1)

## 2022-03-29 NOTE — ED Triage Notes (Signed)
Pt c/o bilateral leg cramping at night x3 days. No dx of RLS pt states she thinks she may have it.

## 2022-03-29 NOTE — ED Provider Notes (Signed)
[facility]    CSN: [id number] Arrival date & time: 03/29/22  1037      History   Chief Complaint Chief Complaint  Patient presents with   Leg Problem    HPI Alicia Fischer is a 62 y.o. female.   HPI  62 year old female here for evaluation of leg cramping.  The patient reports that for the last 3 nights she has been experiencing cramping in the backs of both legs that will start in the backs of her thighs and go down her calves into her feet.  Will cause her toes to curl up.  She states that she has to get up and walk around and then the cramps will subside and she will be able to lay back in bed.  She does not notice any symptoms during the day.  She is concerned that she may have restless leg syndrome.  Her husband states that she does kick her legs in her sleep.  She does take gabapentin 3 times a day including 600 mg at night.  She states she drinks very little water during the day and drinks mostly tea and soda.  Past Medical History:  Diagnosis Date   Chest pain 6/10   probable costochondritits   CHF (congestive heart failure) ([place])    Coronary artery disease    Diabetes mellitus    Dyslipidemia    Dyspnea    Chronic, Echo 7/10: EF 55-60%, no valvular problems   Enlarged heart    GERD (gastroesophageal reflux disease)    History of stroke    Hyperlipidemia    on Lipitor   Hypertension    on Lisinopril/ HCTZ   Obesity    OSA (obstructive sleep apnea)    Stroke ([place]) 2002   without any residual deficit   Type 2 diabetes mellitus ([place])    on metformin    Patient Active Problem List   Diagnosis Date Noted   Swelling of limb 08/12/2020   Carotid stenosis 04/14/2019   Abnormal results of liver function studies 09/14/2013   Abnormal mammogram 09/14/2013   Bunion 09/14/2013   Disorder of kidney and ureter 09/14/2013   Edema 09/14/2013   Generalized osteoarthritis of multiple sites 09/14/2013   Morbid obesity ([place]) 09/14/2013   Gout, unspecified  09/14/2013   Obstructive sleep apnea 09/14/2013   Pain in joint involving ankle and foot 09/14/2013   Cramp of limb 09/14/2013   Restless legs syndrome (RLS) 09/14/2013   Diabetes ([place]) 09/14/2013   SHORTNESS OF BREATH 09/15/2008   Hyperlipidemia 09/11/2008   HYPERTENSION, BENIGN 09/11/2008   CHEST PAIN-UNSPECIFIED 09/11/2008    Past Surgical History:  Procedure Laterality Date   BREAST BIOPSY Left 2014   neg   CARDIAC CATHETERIZATION  2010   CAROTID PTA/STENT INTERVENTION Right 05/25/2019   Procedure: CAROTID PTA/STENT INTERVENTION;  Surgeon: Algernon Huxley, MD;  Location: [facility];  Service: Cardiovascular;  Laterality: Right;   CERVICAL ABLATION      OB History   No obstetric history on file.      Home Medications    Prior to Admission medications   Medication Sig Start Date End Date Taking? Authorizing Provider  acetaminophen (TYLENOL) 325 MG tablet Take 650 mg by mouth daily.    Yes [provider]  amLODipine (NORVASC) 5 MG tablet Take 5 mg by mouth daily.   Yes [provider]  aspirin 81 MG tablet Take 81 mg by mouth daily.   Yes [provider]  atorvastatin (LIPITOR) 80 MG tablet Take 80 mg by mouth daily.   Yes [provider]  cetirizine (ZYRTEC) 10 MG tablet Take 10 mg by mouth daily.   Yes [provider]  clopidogrel (PLAVIX) 75 MG tablet TAKE 1 TABLET BY MOUTH EVERY DAY 10/13/21  Yes Dew, Marlow Baars, MD  colchicine 0.6 MG tablet Take 1 tablet (0.6 mg total) by mouth daily. Then take 2 tablets then 1 hour later take an additional tablet. Thereafter, take 1 tablet daily for 1 week. 10/12/21  Yes Brimage, Vondra, DO  famotidine (PEPCID) 20 MG tablet Take 20 mg by mouth 2 (two) times daily.    Yes [provider]  fexofenadine (ALLEGRA ALLERGY) 180 MG tablet Take 1 tablet (180 mg total) by mouth daily. 06/01/21  Yes Rodriguez-Southworth, Nettie Elm, PA-C  fluticasone (FLONASE) 50 MCG/ACT nasal spray Place 2 sprays  into both nostrils daily. 04/07/21  Yes Covington, Sarah M, PA-C  gabapentin (NEURONTIN) 300 MG capsule Take 300 mg by mouth See admin instructions. Take 1 capsule (300mg ) by mouth every the morning and take 2 capsules (600mg ) every night 04/25/18  Yes [provider]  losartan (COZAAR) 100 MG tablet Take 100 mg by mouth daily.   Yes [provider]  nitroGLYCERIN (NITROSTAT) 0.4 MG SL tablet Place 0.4 mg under the tongue every 5 (five) minutes as needed for chest pain.    Yes [provider]  VOLTAREN 1 % GEL Apply topically. 05/02/20  Yes [provider]    Family History Family History  Problem Relation Age of Onset   Heart attack Father 74   Breast cancer Mother 4   Heart attack Mother     Social History Social History   Tobacco Use   Smoking status: Former    Types: Cigarettes    Quit date: 2001    Years since quitting: 23.0   Smokeless tobacco: Never  Vaping Use   Vaping Use: Never used  Substance Use Topics   Alcohol use: No   Drug use: Never     Allergies   Atenolol, Beta adrenergic blockers, Metformin, Sulfa antibiotics, Codeine, and Contrast media [iodinated contrast media]   Review of Systems Review of Systems  Musculoskeletal:  Positive for myalgias.     Physical Exam Triage Vital Signs ED Triage Vitals  Enc Vitals Group     BP 03/29/22 1056 (!) 140/85     Pulse Rate 03/29/22 1056 86     Resp 03/29/22 1056 16     Temp 03/29/22 1056 98.3 F (36.8 C)     Temp Source 03/29/22 1056 Oral     SpO2 03/29/22 1056 94 %     Weight 03/29/22 1055 270 lb (122.5 kg)     Height 03/29/22 1055 5' 7.5" (1.715 m)     Head Circumference --      Peak Flow --      Pain Score 03/29/22 1055 8     Pain Loc --      Pain Edu? --      Excl. in GC? --    No data found.  Updated Vital Signs BP (!) 140/85 (BP Location: Left Arm)   Pulse 86   Temp 98.3 F (36.8 C) (Oral)   Resp 16   Ht 5' 7.5" (1.715 m)   Wt 270 lb (122.5 kg)    SpO2 94%   BMI 41.66 kg/m   Visual Acuity Right Eye Distance:   Left Eye Distance:   Bilateral Distance:  Right Eye Near:   Left Eye Near:    Bilateral Near:     Physical Exam Vitals and nursing note reviewed.  Constitutional:      Appearance: Normal appearance. She is obese. She is not ill-appearing.  Musculoskeletal:        General: Tenderness present.     Right lower leg: No edema.     Left lower leg: No edema.     Comments: Tenderness to the hamstring and gastrocnemius muscle complexes bilaterally.  Skin:    General: Skin is warm and dry.     Capillary Refill: Capillary refill takes less than 2 seconds.     Findings: No bruising or erythema.  Neurological:     General: No focal deficit present.     Mental Status: She is alert and oriented to person, place, and time.  Psychiatric:        Mood and Affect: Mood normal.        Behavior: Behavior normal.        Thought Content: Thought content normal.        Judgment: Judgment normal.      UC Treatments / Results  Labs (all labs ordered are listed, but only abnormal results are displayed) Labs Reviewed  COMPREHENSIVE METABOLIC PANEL - Abnormal; Notable for the following components:      Result Value   Sodium 133 (*)    Glucose, Bld 103 (*)    BUN 28 (*)    Creatinine, Ser 1.20 (*)    Total Protein 8.6 (*)    GFR, Estimated 51 (*)    All other components within normal limits    EKG   Radiology No results found.  Procedures Procedures (including critical care time)  Medications Ordered in UC Medications - No data to display  Initial Impression / Assessment and Plan / UC Course  I have reviewed the triage vital signs and the nursing notes.  Pertinent labs & imaging results that were available during my care of the patient were reviewed by me and considered in my medical decision making (see chart for details).   Patient is a nontoxic-appearing 65 old female here for evaluation of bilateral leg  cramps that occur at night times the past 3 nights.  No previous episodes of same.  The cramping starts in the backs of both thighs and will spread down the legs, through the calves, into the feet and cause her toes to curl up.  The spasms relax when she gets up and walks around.  The patient drinks a lot of caffeinated beverages throughout the day but does not drink much in the way of water.  She is also on losartan which could be contributing to her potassium deficit.  I have no recent blood work in [facility].  The last CMP on file is from 08/05/2018 performed at [facility].  She does have tenderness to her hamstring and calf muscles but no swelling.  Does not erythematous or hot.  I have low suspicion for blood clot.  I suspect that patient's symptoms are related to electrolyte imbalance or dehydration given her large caffeine intake throughout the day.  I will order a CMP to evaluate her renal function and electrolytes.  CMP shows mild hyponatremia with a sodium of 133.  Glucose is elevated at 103.  Renal function is elevated as well with a BUN of 28 and a creatinine of 1.20.  Total protein mildly elevated 8.6.  GFR is down to 51.  Transaminases are  normal. The only available CMP in epic is from 2020 and at that time the patient's BUN was 21 and her creatinine was 1.1.  This is not significant interval change.  Given patient's renal function and mild sodium decreased I suspect that her cramps are coming from dehydration.  I am going to encourage her to cut back on her soda and tea and increase oral fluid intake of water.  If her symptoms continue she should follow-up with her primary care provider.   Final Clinical Impressions(s) / UC Diagnoses   Final diagnoses:  Nocturnal leg cramps     Discharge Instructions      Your blood work today showed that you have a mildly decreased sodium level but your potassium level is normal.  Your renal function is also mildly decreased which is suggestive of  dehydration.  You need to cut back on tea and soda consumption and increase your intake of water to help maintain hydration.  This should help with your leg cramps as well.  If your leg cramps continue, or they worsen, I recommend following up with your primary care provider.     ED Prescriptions   None    PDMP not reviewed this encounter.   Becky Augusta, NP 03/29/22 [phone]

## 2022-03-29 NOTE — Discharge Instructions (Addendum)
Your blood work today showed that you have a mildly decreased sodium level but your potassium level is normal.  Your renal function is also mildly decreased which is suggestive of dehydration.  You need to cut back on tea and soda consumption and increase your intake of water to help maintain hydration.  This should help with your leg cramps as well.  If your leg cramps continue, or they worsen, I recommend following up with your primary care provider.

## 2022-04-13 ENCOUNTER — Other Ambulatory Visit (INDEPENDENT_AMBULATORY_CARE_PROVIDER_SITE_OTHER): Payer: Self-pay | Admitting: Vascular Surgery

## 2023-02-15 ENCOUNTER — Ambulatory Visit (INDEPENDENT_AMBULATORY_CARE_PROVIDER_SITE_OTHER): Payer: Medicare HMO | Admitting: Vascular Surgery

## 2023-02-15 ENCOUNTER — Ambulatory Visit (INDEPENDENT_AMBULATORY_CARE_PROVIDER_SITE_OTHER): Payer: Medicare HMO

## 2023-02-15 ENCOUNTER — Encounter (INDEPENDENT_AMBULATORY_CARE_PROVIDER_SITE_OTHER): Payer: Self-pay | Admitting: Vascular Surgery

## 2023-02-15 VITALS — BP 138/83 | HR 91 | Resp 18 | Ht 68.0 in | Wt 262.4 lb

## 2023-02-15 DIAGNOSIS — I6523 Occlusion and stenosis of bilateral carotid arteries: Secondary | ICD-10-CM

## 2023-02-15 DIAGNOSIS — E1151 Type 2 diabetes mellitus with diabetic peripheral angiopathy without gangrene: Secondary | ICD-10-CM

## 2023-02-15 DIAGNOSIS — E785 Hyperlipidemia, unspecified: Secondary | ICD-10-CM

## 2023-02-15 DIAGNOSIS — I10 Essential (primary) hypertension: Secondary | ICD-10-CM

## 2023-02-15 NOTE — Assessment & Plan Note (Signed)
Carotid duplex reveals a widely patent right carotid stent and a known left carotid occlusion.  Continue current medications which include aspirin, Plavix, and statin agent.  Continue to follow on an annual basis with duplex.

## 2023-02-15 NOTE — Progress Notes (Signed)
MRN : [id number]  Alicia Fischer is a 62 y.o. (Aug 11, 1960) female who presents with chief complaint of  Chief Complaint  Patient presents with   Follow-up    f/u in 1 year with carotid  .  History of Present Illness: Patient returns today in follow up of Her carotid disease.  She is almost 4 years status post right carotid stent placement for high-grade stenosis with a contralateral occlusion.  She is doing well.  She has a remote history of stroke symptoms but nothing since her last visit.  She denies any focal neurologic symptoms.Specifically, the patient denies amaurosis fugax, speech or swallowing difficulties, or arm or leg weakness or numbness.  Carotid duplex reveals a widely patent right carotid stent and a known left carotid occlusion.  Current Outpatient Medications  Medication Sig Dispense Refill   acetaminophen (TYLENOL) 325 MG tablet Take 650 mg by mouth daily.      Albuterol Sulfate (PROAIR RESPICLICK) 108 (90 Base) MCG/ACT AEPB Inhale into the lungs.     aspirin 81 MG tablet Take 81 mg by mouth daily.     atorvastatin (LIPITOR) 80 MG tablet Take 80 mg by mouth daily.     clopidogrel (PLAVIX) 75 MG tablet TAKE 1 TABLET BY MOUTH EVERY DAY 90 tablet 3   colchicine 0.6 MG tablet Take 1 tablet (0.6 mg total) by mouth daily. Then take 2 tablets then 1 hour later take an additional tablet. Thereafter, take 1 tablet daily for 1 week. 9 tablet 0   famotidine (PEPCID) 20 MG tablet Take 20 mg by mouth 2 (two) times daily.      fexofenadine (ALLEGRA ALLERGY) 180 MG tablet Take 1 tablet (180 mg total) by mouth daily. 30 tablet 0   fluticasone (FLONASE) 50 MCG/ACT nasal spray Place 2 sprays into both nostrils daily. 16 mL 0   gabapentin (NEURONTIN) 300 MG capsule Take 300 mg by mouth See admin instructions. Take 1 capsule (300mg ) by mouth every the morning and take 2 capsules (600mg ) every night     losartan (COZAAR) 100 MG tablet Take 100 mg by mouth daily.     nitroGLYCERIN  (NITROSTAT) 0.4 MG SL tablet Place 0.4 mg under the tongue every 5 (five) minutes as needed for chest pain.      phentermine (ADIPEX-P) 37.5 MG tablet Take by mouth every morning.     VOLTAREN 1 % GEL Apply topically.     amLODipine (NORVASC) 5 MG tablet Take 5 mg by mouth daily. (Patient not taking: Reported on 02/15/2023)     cetirizine (ZYRTEC) 10 MG tablet Take 10 mg by mouth daily.     No current facility-administered medications for this visit.    Past Medical History:  Diagnosis Date   Chest pain 6/10   probable costochondritits   CHF (congestive heart failure) ([facility])    Coronary artery disease    Diabetes mellitus    Dyslipidemia    Dyspnea    Chronic, Echo 7/10: EF 55-60%, no valvular problems   Enlarged heart    GERD (gastroesophageal reflux disease)    History of stroke    Hyperlipidemia    on Lipitor   Hypertension    on Lisinopril/ HCTZ   Obesity    OSA (obstructive sleep apnea)    Stroke ([facility]) 2002   without any residual deficit   Type 2 diabetes mellitus ([facility])    on metformin    Past Surgical History:  Procedure Laterality Date   BREAST BIOPSY  Left 2014   neg   CARDIAC CATHETERIZATION  2010   CAROTID PTA/STENT INTERVENTION Right 05/25/2019   Procedure: CAROTID PTA/STENT INTERVENTION;  Surgeon: Annice Needy, MD;  Location: [facility];  Service: Cardiovascular;  Laterality: Right;   CERVICAL ABLATION       Social History   Tobacco Use   Smoking status: Former    Current packs/day: 0.00    Types: Cigarettes    Quit date: 2001    Years since quitting: 23.9   Smokeless tobacco: Never  Vaping Use   Vaping status: Never Used  Substance Use Topics   Alcohol use: No   Drug use: Never      Family History  Problem Relation Age of Onset   Heart attack Father 54   Breast cancer Mother 19   Heart attack Mother      Allergies  Allergen Reactions   Atenolol Other (See Comments)   Beta Adrenergic Blockers    Metformin Other (See  Comments)   Sulfa Antibiotics Nausea And Vomiting   Codeine Rash and Other (See Comments)   Contrast Media [Iodinated Contrast Media] Rash    Other reaction(s): Unknown     REVIEW OF SYSTEMS (Negative unless checked)   Constitutional: [] Weight loss  [] Fever  [] Chills Cardiac: [x] Chest pain   [] Chest pressure   [] Palpitations   [] Shortness of breath when laying flat   [] Shortness of breath at rest   [x] Shortness of breath with exertion. Vascular:  [] Pain in legs with walking   [] Pain in legs at rest   [] Pain in legs when laying flat   [] Claudication   [] Pain in feet when walking  [] Pain in feet at rest  [] Pain in feet when laying flat   [] History of DVT   [] Phlebitis   [] Swelling in legs   [] Varicose veins   [] Non-healing ulcers Pulmonary:   [] Uses home oxygen   [] Productive cough   [] Hemoptysis   [] Wheeze  [] COPD   [] Asthma Neurologic:  [] Dizziness  [] Blackouts   [] Seizures   [x] History of stroke   [] History of TIA  [] Aphasia   [] Temporary blindness   [] Dysphagia   [] Weakness or numbness in arms   [] Weakness or numbness in legs Musculoskeletal:  [x] Arthritis   [] Joint swelling   [x] Joint pain   [] Low back pain Hematologic:  [] Easy bruising  [] Easy bleeding   [] Hypercoagulable state   [] Anemic  [] Hepatitis Gastrointestinal:  [] Blood in stool   [] Vomiting blood  [x] Gastroesophageal reflux/heartburn   [] Abdominal pain Genitourinary:  [] Chronic kidney disease   [] Difficult urination  [] Frequent urination  [] Burning with urination   [] Hematuria Skin:  [] Rashes   [] Ulcers   [] Wounds Psychological:  [] History of anxiety   []  History of major depression.  Physical Examination  BP 138/83 (BP Location: Left Wrist)   Pulse 91   Resp 18   Ht 5\' 8"  (1.727 m)   Wt 262 lb 6.4 oz (119 kg)   BMI 39.90 kg/m  Gen:  WD/WN, NAD Head: [place]/AT, No temporalis wasting. Ear/Nose/Throat: Hearing grossly intact, nares w/o erythema or drainage Eyes: Conjunctiva clear. Sclera non-icteric Neck: Supple.  Trachea  midline. No bruit.  Pulmonary:  Good air movement, no use of accessory muscles.  Cardiac: RRR, no JVD Vascular:  Vessel Right Left  Radial Palpable Palpable           Musculoskeletal: M/S 5/5 throughout.  No deformity or atrophy. Trace LE edema. Neurologic: Sensation grossly intact in extremities.  Symmetrical.  Speech is fluent.  Psychiatric: Judgment intact, Mood & affect appropriate for pt's clinical situation. Dermatologic: No rashes or ulcers noted.  No cellulitis or open wounds.      Labs No results found for this or any previous visit (from the past 2160 hour(s)).  Radiology No results found.  Assessment/Plan  Carotid stenosis Carotid duplex reveals a widely patent right carotid stent and a known left carotid occlusion.  Continue current medications which include aspirin, Plavix, and statin agent.  Continue to follow on an annual basis with duplex.  HYPERTENSION, BENIGN blood pressure control important in reducing the progression of atherosclerotic disease. On appropriate oral medications.     Diabetes (HCC) blood glucose control important in reducing the progression of atherosclerotic disease. Also, involved in wound healing. On appropriate medications.     Hyperlipidemia lipid control important in reducing the progression of atherosclerotic disease. Continue statin therapy   Festus Barren, MD  02/15/2023 9:57 AM    This note was created with Dragon medical transcription system.  Any errors from dictation are purely unintentional

## 2023-02-22 ENCOUNTER — Other Ambulatory Visit: Payer: Self-pay | Admitting: Student

## 2023-02-22 ENCOUNTER — Encounter: Payer: Self-pay | Admitting: Student

## 2023-02-22 DIAGNOSIS — Z1231 Encounter for screening mammogram for malignant neoplasm of breast: Secondary | ICD-10-CM

## 2023-03-20 ENCOUNTER — Other Ambulatory Visit: Payer: Self-pay | Admitting: Physician Assistant

## 2023-03-20 ENCOUNTER — Encounter: Payer: Self-pay | Admitting: Family Medicine

## 2023-03-20 DIAGNOSIS — Z1231 Encounter for screening mammogram for malignant neoplasm of breast: Secondary | ICD-10-CM

## 2023-03-21 ENCOUNTER — Ambulatory Visit
Admission: RE | Admit: 2023-03-21 | Discharge: 2023-03-21 | Disposition: A | Discharge status: Home / Self Care | Payer: Medicare HMO | Source: Ambulatory Visit | Attending: Physician Assistant | Admitting: Physician Assistant

## 2023-03-21 DIAGNOSIS — Z1231 Encounter for screening mammogram for malignant neoplasm of breast: Secondary | ICD-10-CM | POA: Insufficient documentation

## 2023-03-22 ENCOUNTER — Encounter: Payer: Self-pay | Admitting: Physician Assistant

## 2023-03-25 ENCOUNTER — Other Ambulatory Visit: Payer: Self-pay

## 2023-03-25 ENCOUNTER — Emergency Department
Admission: EM | Admit: 2023-03-25 | Discharge: 2023-03-25 | Disposition: A | Discharge status: Home / Self Care | Payer: Medicare HMO | Attending: Emergency Medicine | Admitting: Emergency Medicine

## 2023-03-25 ENCOUNTER — Emergency Department: Payer: Medicare HMO

## 2023-03-25 ENCOUNTER — Other Ambulatory Visit: Payer: Self-pay | Admitting: Physician Assistant

## 2023-03-25 DIAGNOSIS — N644 Mastodynia: Secondary | ICD-10-CM | POA: Insufficient documentation

## 2023-03-25 DIAGNOSIS — R928 Other abnormal and inconclusive findings on diagnostic imaging of breast: Secondary | ICD-10-CM

## 2023-03-25 DIAGNOSIS — I509 Heart failure, unspecified: Secondary | ICD-10-CM | POA: Insufficient documentation

## 2023-03-25 DIAGNOSIS — I10 Essential (primary) hypertension: Secondary | ICD-10-CM | POA: Diagnosis not present

## 2023-03-25 DIAGNOSIS — R0789 Other chest pain: Secondary | ICD-10-CM | POA: Insufficient documentation

## 2023-03-25 DIAGNOSIS — R921 Mammographic calcification found on diagnostic imaging of breast: Secondary | ICD-10-CM

## 2023-03-25 DIAGNOSIS — E119 Type 2 diabetes mellitus without complications: Secondary | ICD-10-CM | POA: Diagnosis not present

## 2023-03-25 LAB — CBC WITH DIFFERENTIAL/PLATELET
Abs Immature Granulocytes: 0.02 10*3/uL (ref 0.00–0.07)
Basophils Absolute: 0.1 10*3/uL (ref 0.0–0.1)
Basophils Relative: 1 %
Eosinophils Absolute: 0.3 10*3/uL (ref 0.0–0.5)
Eosinophils Relative: 4 %
HCT: 41 % (ref 36.0–46.0)
Hemoglobin: 13 g/dL (ref 12.0–15.0)
Immature Granulocytes: 0 %
Lymphocytes Relative: 32 %
Lymphs Abs: 2.3 10*3/uL (ref 0.7–4.0)
MCH: 27.2 pg (ref 26.0–34.0)
MCHC: 31.7 g/dL (ref 30.0–36.0)
MCV: 85.8 fL (ref 80.0–100.0)
Monocytes Absolute: 0.5 10*3/uL (ref 0.1–1.0)
Monocytes Relative: 7 %
Neutro Abs: 4.1 10*3/uL (ref 1.7–7.7)
Neutrophils Relative %: 56 %
Platelets: 325 10*3/uL (ref 150–400)
RBC: 4.78 MIL/uL (ref 3.87–5.11)
RDW: 15.9 % — ABNORMAL HIGH (ref 11.5–15.5)
WBC: 7.3 10*3/uL (ref 4.0–10.5)
nRBC: 0 % (ref 0.0–0.2)

## 2023-03-25 LAB — TROPONIN I (HIGH SENSITIVITY)
Troponin I (High Sensitivity): 3 ng/L (ref <18)
Troponin I (High Sensitivity): 3 ng/L (ref <18)

## 2023-03-25 LAB — BASIC METABOLIC PANEL
Anion gap: 10 (ref 5–15)
BUN: 22 mg/dL (ref 8–23)
CO2: 23 mmol/L (ref 22–32)
Calcium: 9.3 mg/dL (ref 8.9–10.3)
Chloride: 100 mmol/L (ref 98–111)
Creatinine, Ser: 1.24 mg/dL — ABNORMAL HIGH (ref 0.44–1.00)
GFR, Estimated: 49 mL/min — ABNORMAL LOW (ref >60)
Glucose, Bld: 93 mg/dL (ref 70–99)
Potassium: 5.2 mmol/L — ABNORMAL HIGH (ref 3.5–5.1)
Sodium: 133 mmol/L — ABNORMAL LOW (ref 135–145)

## 2023-03-25 MED ORDER — CYCLOBENZAPRINE HCL 10 MG PO TABS
10.0000 mg | ORAL_TABLET | Freq: Once | ORAL | Status: AC
  Administered 2023-03-25: 10 mg via ORAL
  Filled 2023-03-25: qty 1

## 2023-03-25 MED ORDER — CYCLOBENZAPRINE HCL 5 MG PO TABS: 5.0000 mg | ORAL_TABLET | Freq: Three times a day (TID) | ORAL | 0 refills | Status: AC | PRN

## 2023-03-25 MED ORDER — HYDROCODONE-ACETAMINOPHEN 5-325 MG PO TABS: 1.0000 | ORAL_TABLET | Freq: Three times a day (TID) | ORAL | 0 refills | Status: AC | PRN

## 2023-03-25 NOTE — ED Provider Notes (Signed)
 [facility] Emergency Department Provider Note     Event Date/Time   First MD Initiated Contact with Patient 03/25/23 1637     (approximate)   History   Breast Pain   HPI  Alicia Fischer is a 63 y.o. female with a history of gout, DM, obesity, HTN, CVA, HLD, and CHF who presents to the ED for evaluation of left-sided chest wall/breast pain. She would endorse two weeks of pain from the left breast that radiates into her left axilla.  Patient was evaluated by her PCP at the onset of her symptoms, and sent for a screening mammogram.  She reports that mammogram now warrants a diagnostic mammogram which is scheduled.  Patient had a left breast biopsy some 8 years prior with seed markers placed at that time.   Physical Exam   Triage Vital Signs: ED Triage Vitals  Encounter Vitals Group     BP 03/25/23 1307 (!) 148/74     Systolic BP Percentile --      Diastolic BP Percentile --      Pulse Rate 03/25/23 1307 93     Resp 03/25/23 1307 16     Temp 03/25/23 1307 97.9 F (36.6 C)     Temp Source 03/25/23 1307 Oral     SpO2 03/25/23 1307 99 %     Weight 03/25/23 1305 262 lb 5.6 oz (119 kg)     Height --      Head Circumference --      Peak Flow --      Pain Score 03/25/23 1304 7     Pain Loc --      Pain Education --      Exclude from Growth Chart --     Most recent vital signs: Vitals:   03/25/23 1307  BP: (!) 148/74  Pulse: 93  Resp: 16  Temp: 97.9 F (36.6 C)  SpO2: 99%    General Awake, no distress. NAD HEENT NCAT. PERRL. EOMI. No rhinorrhea. Mucous membranes are moist.  CV:  Good peripheral perfusion. RRR RESP:  Normal effort. CTA ABD:  No distention.  BREAST: Left breast without obvious deformity, puckering, peau d'orange, induration, warmth, erythema, or nipple retraction.  No palpable clavicular or axillary lymph nodes.   ED Results / Procedures / Treatments   Labs (all labs ordered are listed, but only abnormal results are  displayed) Labs Reviewed  BASIC METABOLIC PANEL - Abnormal; Notable for the following components:      Result Value   Sodium 133 (*)    Potassium 5.2 (*)    Creatinine, Ser 1.24 (*)    GFR, Estimated 49 (*)    All other components within normal limits  CBC WITH DIFFERENTIAL/PLATELET - Abnormal; Notable for the following components:   RDW 15.9 (*)    All other components within normal limits  TROPONIN I (HIGH SENSITIVITY)  TROPONIN I (HIGH SENSITIVITY)     EKG  Vent. rate 88 BPM PR interval 162 ms QRS duration 84 ms QT/QTcB 368/445 ms P-R-T axes 52 63 42 Normal sinus rhythm Normal ECG   RADIOLOGY  I personally viewed and evaluated these images as part of my medical decision making, as well as reviewing the written report by the radiologist.  ED Provider Interpretation: no acute finding  No results found.    PROCEDURES:  Critical Care performed: No  Procedures   MEDICATIONS ORDERED IN ED: Medications  cyclobenzaprine  (FLEXERIL ) tablet 10 mg (10 mg Oral Given  03/25/23 1720)     IMPRESSION / MDM / ASSESSMENT AND PLAN / ED COURSE  I reviewed the triage vital signs and the nursing notes.                              Differential diagnosis includes, but is not limited to, breast pain, musculoskeletal chest pain, costochondritis, chest wall contusion, cardiac etiology, pulmonary etiology  Patient's presentation is most consistent with acute complicated illness / injury requiring diagnostic workup.  Patient's diagnosis is consistent with left breast pain of unclear etiology.  Patient with reassuring exam and workup at this time including EKG, chest x-ray, and basic labs.  No acute lab abnormalities are noted.  No intrathoracic process appreciated send interpretation of images.  No concerning findings on left breast exam.  Symptoms may represent a musculoskeletal etiology of chest pain.  Patient will be discharged home with prescriptions for cyclobenzaprine  and  hydrocodone . Patient is to follow up with her PCP as scheduled, as needed or otherwise directed. Patient is given ED precautions to return to the ED for any worsening or new symptoms.  FINAL CLINICAL IMPRESSION(S) / ED DIAGNOSES   Final diagnoses:  Breast pain, left  Musculoskeletal chest pain     Rx / DC Orders   ED Discharge Orders          Ordered    cyclobenzaprine  (FLEXERIL ) 5 MG tablet  3 times daily PRN        03/25/23 1716    HYDROcodone -acetaminophen  (NORCO) 5-325 MG tablet  3 times daily PRN        03/25/23 1716             Note:  This document was prepared using Dragon voice recognition software and may include unintentional dictation errors.    Loyd Candida LULLA Aldona, PA-C 03/26/23 2335    Dorothyann Drivers, MD 03/27/23 [phone]

## 2023-03-25 NOTE — ED Provider Triage Note (Signed)
 Emergency Medicine Provider Triage Evaluation Note  Alicia Fischer , a 63 y.o. female  was evaluated in triage.  Pt complains of left breast pain that has been ongoing for 2 weeks. Was seen by primary care and had a mammogram. Review of the chart shows diagnostic mammogram read as calcifications in the left breast, recommended diagnostic mammogram. Here today because the pain is getting worse.   Review of Systems  Positive: Left breast pain  Negative: Pain with breathing, SOB, CP  Physical Exam  There were no vitals taken for this visit. Gen:   Awake, no distress   Resp:  Normal effort  MSK:   Moves extremities without difficulty  Other:    Medical Decision Making  Medically screening exam initiated at 1:03 PM.  Appropriate orders placed.  [name] was informed that the remainder of the evaluation will be completed by another provider, this initial triage assessment does not replace that evaluation, and the importance of remaining in the ED until their evaluation is complete.    Cleaster Tinnie LABOR, PA-C 03/25/23 1308

## 2023-03-25 NOTE — Discharge Instructions (Addendum)
 Your exam, labs, EKG, and chest x-ray are all normal and reassuring at this time.  No significant lab abnormalities are noted.  No evidence of a lung infection.  Your symptoms are consistent with likely musculoskeletal pain to the chest wall.  You should follow-up with your primary provider as scheduled.  Take the prescription medicines as directed.

## 2023-03-25 NOTE — ED Triage Notes (Signed)
 Left breast pain x 2 weeks radiating to left axilla. Seen by PCP last week for same, mammogram done.  History of biopsy of left breast 8 years.

## 2023-03-28 ENCOUNTER — Ambulatory Visit
Admission: RE | Admit: 2023-03-28 | Discharge: 2023-03-28 | Disposition: A | Discharge status: Home / Self Care | Payer: Medicare HMO | Source: Ambulatory Visit | Attending: Physician Assistant | Admitting: Physician Assistant

## 2023-03-28 DIAGNOSIS — R921 Mammographic calcification found on diagnostic imaging of breast: Secondary | ICD-10-CM | POA: Diagnosis present

## 2023-03-28 DIAGNOSIS — R928 Other abnormal and inconclusive findings on diagnostic imaging of breast: Secondary | ICD-10-CM | POA: Diagnosis present

## 2023-03-29 ENCOUNTER — Other Ambulatory Visit: Payer: Self-pay | Admitting: Student

## 2023-03-29 DIAGNOSIS — R921 Mammographic calcification found on diagnostic imaging of breast: Secondary | ICD-10-CM

## 2023-03-29 DIAGNOSIS — R928 Other abnormal and inconclusive findings on diagnostic imaging of breast: Secondary | ICD-10-CM

## 2023-04-03 ENCOUNTER — Other Ambulatory Visit (INDEPENDENT_AMBULATORY_CARE_PROVIDER_SITE_OTHER): Payer: Self-pay | Admitting: Vascular Surgery

## 2023-04-11 ENCOUNTER — Ambulatory Visit
Admission: RE | Admit: 2023-04-11 | Discharge: 2023-04-11 | Disposition: A | Discharge status: Home / Self Care | Payer: Medicare HMO | Source: Ambulatory Visit | Attending: Student | Admitting: Student

## 2023-04-11 DIAGNOSIS — R921 Mammographic calcification found on diagnostic imaging of breast: Secondary | ICD-10-CM | POA: Insufficient documentation

## 2023-04-11 DIAGNOSIS — R928 Other abnormal and inconclusive findings on diagnostic imaging of breast: Secondary | ICD-10-CM

## 2023-04-11 DIAGNOSIS — N6012 Diffuse cystic mastopathy of left breast: Secondary | ICD-10-CM | POA: Insufficient documentation

## 2023-04-11 HISTORY — PX: BREAST BIOPSY: SHX20

## 2023-04-11 MED ORDER — LIDOCAINE-EPINEPHRINE 1 %-1:100000 IJ SOLN
20.0000 mL | Freq: Once | INTRAMUSCULAR | Status: AC
  Administered 2023-04-11: 20 mL
  Filled 2023-04-11: qty 20

## 2023-04-11 MED ORDER — LIDOCAINE 1 % OPTIME INJ - NO CHARGE
5.0000 mL | Freq: Once | INTRAMUSCULAR | Status: AC
  Administered 2023-04-11: 5 mL
  Filled 2023-04-11: qty 6

## 2023-04-12 LAB — SURGICAL PATHOLOGY

## 2023-04-18 ENCOUNTER — Ambulatory Visit
Admission: RE | Admit: 2023-04-18 | Discharge: 2023-04-18 | Disposition: A | Discharge status: Home / Self Care | Payer: Medicare HMO | Source: Ambulatory Visit | Attending: Physician Assistant | Admitting: Physician Assistant

## 2023-04-18 ENCOUNTER — Other Ambulatory Visit: Payer: Self-pay | Admitting: Physician Assistant

## 2023-04-18 DIAGNOSIS — N644 Mastodynia: Secondary | ICD-10-CM | POA: Insufficient documentation

## 2023-04-18 DIAGNOSIS — N611 Abscess of the breast and nipple: Secondary | ICD-10-CM | POA: Diagnosis present

## 2023-06-30 NOTE — Progress Notes (Signed)
 Established Patient Visit   Chief Complaint: Chief Complaint  Patient presents with  . Follow-up    6 months- no complaints   Date of Service: 06/27/2023 Date of Birth: 1960/06/15 PCP: Ricard Maus (Inactive)  History of Present Illness: [name] is a 63 y.o.female patient who history of obstructive sleep apnea obesity chest pain hypertension lower extremity edema former smoker with known renal insufficiency states to be doing reasonably well.  Patient states to be currently  using her CPAP.  History of GERD hyperlipidemia currently on Plavix  therapy.  Denies any significant chest pain does not have a good exercise routine yet but states she is going to start  Past Medical and Surgical History  Past Medical History Past Medical History:  Diagnosis Date  . Chronic kidney disease   . Diabetes mellitus type 2, uncomplicated (CMS/HHS-HCC)   . GERD (gastroesophageal reflux disease)   . Gout   . Hyperlipidemia   . Hypertension   . Sleep apnea     Past Surgical History She has no past surgical history on file.   Medications and Allergies  Current Medications  Current Outpatient Medications  Medication Sig Dispense Refill  . acetaminophen  (TYLENOL ) 325 MG tablet Take 650 mg by mouth as needed    . albuterol (PROAIR RESPICLICK) 90 mcg/actuation inhaler Inhale 2 inhalations into the lungs every 6 (six) hours as needed for Wheezing 1 each 3  . ALLERGY RELIEF, FEXOFENADINE , 180 mg tablet     . aspirin  81 MG EC tablet Take 81 mg by mouth once daily.    . atorvastatin  (LIPITOR ) 80 MG tablet Take 80 mg by mouth once daily.    . clopidogreL  (PLAVIX ) 75 mg tablet Take 1 tablet by mouth once daily    . famotidine  (PEPCID ) 20 MG tablet Take 20 mg by mouth 2 (two) times daily    . gabapentin  (NEURONTIN ) 300 MG capsule Take 300 mg by mouth 2 (two) times daily       . losartan  (COZAAR ) 100 MG tablet Take 1 tablet by mouth once daily    . nitroGLYcerin  (NITROSTAT ) 0.4 MG SL tablet Place 1  tablet (0.4 mg total) under the tongue every 5 (five) minutes as needed for Chest pain May take up to 3 doses. 25 tablet 11   No current facility-administered medications for this visit.    Allergies: Atenolol, Codeine, Flu vacc 2012 (18+)c.deriv(pf), Iodinated contrast media, and Metformin  Social and Family History  Social History  reports that she quit smoking about 23 years ago. Her smoking use included cigarettes. She started smoking about 45 years ago. She has a 22.5 pack-year smoking history. She has never used smokeless tobacco.  Family History Family History  Problem Relation Name Age of Onset  . Heart disease Mother    . Coronary Artery Disease (Blocked arteries around heart) Father    . Hyperlipidemia (Elevated cholesterol) Brother    . High blood pressure (Hypertension) Brother      Review of Systems   Review of Systems: The patient denies chest pain, shortness of breath, orthopnea, paroxysmal nocturnal dyspnea, pedal edema, palpitations, heart racing, presyncope, syncope. Review of 12 Systems is negative except as described above.  Physical Examination   Vitals:BP (!) 153/90 (BP Location: Left forearm, Patient Position: Sitting, BP Cuff Size: Adult)   Pulse 87   Resp 16   Ht 172.7 cm (5' 8)   Wt (!) 116 kg (255 lb 12.8 oz)   SpO2 93%   BMI 38.89 kg/m  Ht:172.7 cm (5' 8) Wt:(!) 116 kg (255 lb 12.8 oz) ADJ:Anib surface area is 2.36 meters squared. Body mass index is 38.89 kg/m.  HEENT: Pupils equally reactive to light and accomodation  Neck: Supple without thyromegaly, carotid pulses 2+ Lungs: clear to auscultation bilaterally; no wheezes, rales, rhonchi Heart: Regular rate and rhythm.  No gallops, murmurs or rub Abdomen: soft nontender, nondistended, with normal bowel sounds Extremities: no cyanosis, clubbing, or edema Peripheral Pulses: 2+ in all extremities, 2+ femoral pulses bilaterally Neurologic: Alert and oriented X3; speech intact; face symmetrical;  moves all extremities well  Assessment   63 y.o. female with  1. OSA (obstructive sleep apnea)   2. Stenosis of left carotid artery   3. Essential (primary) hypertension   4. Obesity, Class III, BMI 40-49.9 (morbid obesity)   5. Type 2 diabetes mellitus with diabetic peripheral angiopathy without gangrene, unspecified whether long term insulin  use (CMS/HHS-HCC)   6. Mixed hyperlipidemia   7. Chest pain, unspecified type   8. Obstructive sleep apnea (adult) (pediatric)   9. Shortness of breath   10. Morbid obesity (CMS/HHS-HCC)   11. Edema, unspecified type        Plan  Obstructive sleep apnea recommend sleep study CPAP weight loss Obesity recommend weight loss exercise portion control Diabetes type 2 continue diet and exercise Former history of smoking continue advised patient refrain from tobacco abuse Hyperlipidemia continue Lipitor  therapy for lipid management GERD currently on Pepcid  consider omeprazole if symptoms persist Hypertension agree with lovastatin therapy for blood pressure management Edema lower extremities agree with lovastatin consider loop diuretic like Lasix Peripheral vascular disease with carotid disease continue aspirin  Plavix  statin blood pressure control follow-up with vascular Have the patient follow-up in 6 months     Return in about 6 months (around 12/27/2023).  DWAYNE D CALLWOOD, MD  This dictation was prepared with dragon dictation.  Any transcription errors that result from this process are unintentional.

## 2023-07-05 ENCOUNTER — Other Ambulatory Visit: Payer: Self-pay | Admitting: Physician Assistant

## 2023-07-05 DIAGNOSIS — N644 Mastodynia: Secondary | ICD-10-CM

## 2023-07-15 ENCOUNTER — Other Ambulatory Visit: Payer: Self-pay | Admitting: Physician Assistant

## 2023-07-15 ENCOUNTER — Ambulatory Visit
Admission: RE | Admit: 2023-07-15 | Discharge: 2023-07-15 | Disposition: A | Discharge status: Home / Self Care | Source: Ambulatory Visit | Attending: Physician Assistant | Admitting: Physician Assistant

## 2023-07-15 DIAGNOSIS — N644 Mastodynia: Secondary | ICD-10-CM | POA: Diagnosis present

## 2023-07-15 DIAGNOSIS — N611 Abscess of the breast and nipple: Secondary | ICD-10-CM

## 2023-07-16 ENCOUNTER — Other Ambulatory Visit: Payer: Self-pay | Admitting: Family Medicine

## 2023-07-16 DIAGNOSIS — N611 Abscess of the breast and nipple: Secondary | ICD-10-CM

## 2023-07-17 ENCOUNTER — Other Ambulatory Visit: Payer: Self-pay | Admitting: General Surgery

## 2023-07-17 ENCOUNTER — Other Ambulatory Visit: Payer: Self-pay | Admitting: Family Medicine

## 2023-07-17 ENCOUNTER — Encounter: Payer: Self-pay | Admitting: *Deleted

## 2023-07-17 ENCOUNTER — Ambulatory Visit
Admission: RE | Admit: 2023-07-17 | Discharge: 2023-07-17 | Disposition: A | Discharge status: Home / Self Care | Source: Ambulatory Visit | Attending: Family Medicine | Admitting: Family Medicine

## 2023-07-17 ENCOUNTER — Ambulatory Visit
Admission: RE | Admit: 2023-07-17 | Discharge: 2023-07-17 | Disposition: A | Discharge status: Home / Self Care | Source: Ambulatory Visit | Attending: Physician Assistant | Admitting: Physician Assistant

## 2023-07-17 DIAGNOSIS — N6489 Other specified disorders of breast: Secondary | ICD-10-CM

## 2023-07-17 DIAGNOSIS — N611 Abscess of the breast and nipple: Secondary | ICD-10-CM

## 2023-07-17 DIAGNOSIS — S20159A Superficial foreign body of breast, unspecified breast, initial encounter: Secondary | ICD-10-CM

## 2023-07-17 MED ORDER — LIDOCAINE HCL 1 % IJ SOLN
5.0000 mL | Freq: Once | INTRAMUSCULAR | Status: AC
  Administered 2023-07-17: 5 mL
  Filled 2023-07-17: qty 5

## 2023-07-17 NOTE — Progress Notes (Signed)
 Alicia Fischer from [facility] messaged to see if I could assist in getting Alicia Fischer set up with a surgeon to retrieve a needle that broke off during aspiration.   She will see Dr. Charmel Cooter tomorrow at 9:30.   Appt. Details given to her.

## 2023-07-18 ENCOUNTER — Ambulatory Visit: Admitting: Certified Registered"

## 2023-07-18 ENCOUNTER — Ambulatory Visit
Admission: RE | Admit: 2023-07-18 | Discharge: 2023-07-18 | Disposition: A | Discharge status: Home / Self Care | Source: Ambulatory Visit | Attending: General Surgery | Admitting: General Surgery

## 2023-07-18 ENCOUNTER — Other Ambulatory Visit: Payer: Self-pay

## 2023-07-18 ENCOUNTER — Encounter: Payer: Self-pay | Admitting: General Surgery

## 2023-07-18 ENCOUNTER — Ambulatory Visit
Admission: RE | Admit: 2023-07-18 | Discharge: 2023-07-18 | Disposition: A | Discharge status: Home / Self Care | Source: Ambulatory Visit | Attending: Diagnostic Radiology | Admitting: Diagnostic Radiology

## 2023-07-18 ENCOUNTER — Ambulatory Visit
Admission: RE | Admit: 2023-07-18 | Discharge: 2023-07-18 | Disposition: A | Discharge status: Home / Self Care | Attending: General Surgery | Admitting: General Surgery

## 2023-07-18 ENCOUNTER — Ambulatory Visit: Payer: Self-pay | Admitting: General Surgery

## 2023-07-18 ENCOUNTER — Encounter
Admission: RE | Disposition: A | Discharge status: Home / Self Care | Payer: Self-pay | Source: Home / Self Care | Attending: General Surgery

## 2023-07-18 ENCOUNTER — Other Ambulatory Visit: Payer: Self-pay | Admitting: Diagnostic Radiology

## 2023-07-18 DIAGNOSIS — Z6838 Body mass index (BMI) 38.0-38.9, adult: Secondary | ICD-10-CM | POA: Insufficient documentation

## 2023-07-18 DIAGNOSIS — L7632 Postprocedural hematoma of skin and subcutaneous tissue following other procedure: Secondary | ICD-10-CM | POA: Insufficient documentation

## 2023-07-18 DIAGNOSIS — Z9889 Other specified postprocedural states: Secondary | ICD-10-CM

## 2023-07-18 DIAGNOSIS — N6489 Other specified disorders of breast: Secondary | ICD-10-CM

## 2023-07-18 DIAGNOSIS — I69351 Hemiplegia and hemiparesis following cerebral infarction affecting right dominant side: Secondary | ICD-10-CM | POA: Diagnosis not present

## 2023-07-18 DIAGNOSIS — Z87891 Personal history of nicotine dependence: Secondary | ICD-10-CM | POA: Diagnosis not present

## 2023-07-18 DIAGNOSIS — G4733 Obstructive sleep apnea (adult) (pediatric): Secondary | ICD-10-CM | POA: Diagnosis not present

## 2023-07-18 DIAGNOSIS — S20159A Superficial foreign body of breast, unspecified breast, initial encounter: Secondary | ICD-10-CM

## 2023-07-18 DIAGNOSIS — N644 Mastodynia: Secondary | ICD-10-CM | POA: Insufficient documentation

## 2023-07-18 DIAGNOSIS — E669 Obesity, unspecified: Secondary | ICD-10-CM | POA: Diagnosis not present

## 2023-07-18 DIAGNOSIS — K219 Gastro-esophageal reflux disease without esophagitis: Secondary | ICD-10-CM | POA: Diagnosis not present

## 2023-07-18 DIAGNOSIS — N641 Fat necrosis of breast: Secondary | ICD-10-CM | POA: Diagnosis not present

## 2023-07-18 DIAGNOSIS — I110 Hypertensive heart disease with heart failure: Secondary | ICD-10-CM | POA: Diagnosis not present

## 2023-07-18 DIAGNOSIS — E119 Type 2 diabetes mellitus without complications: Secondary | ICD-10-CM | POA: Diagnosis not present

## 2023-07-18 DIAGNOSIS — Z01812 Encounter for preprocedural laboratory examination: Secondary | ICD-10-CM

## 2023-07-18 DIAGNOSIS — I509 Heart failure, unspecified: Secondary | ICD-10-CM | POA: Diagnosis not present

## 2023-07-18 DIAGNOSIS — T81596A Other complications of foreign body accidentally left in body following aspiration, puncture or other catheterization, initial encounter: Secondary | ICD-10-CM | POA: Insufficient documentation

## 2023-07-18 DIAGNOSIS — Y658 Other specified misadventures during surgical and medical care: Secondary | ICD-10-CM | POA: Diagnosis not present

## 2023-07-18 DIAGNOSIS — I2510 Atherosclerotic heart disease of native coronary artery without angina pectoris: Secondary | ICD-10-CM | POA: Insufficient documentation

## 2023-07-18 HISTORY — PX: BREAST BIOPSY: SHX20

## 2023-07-18 HISTORY — PX: EXPLORATION OF PENETRATING WOUND: SHX7316

## 2023-07-18 LAB — GLUCOSE, CAPILLARY: Glucose-Capillary: 104 mg/dL — ABNORMAL HIGH (ref 70–99)

## 2023-07-18 SURGERY — EXPLORATION OF PENETRATING WOUND
Anesthesia: General | Site: Breast | Laterality: Left

## 2023-07-18 MED ORDER — FENTANYL CITRATE (PF) 100 MCG/2ML IJ SOLN: 25.0000 ug | INTRAMUSCULAR | Status: DC | PRN

## 2023-07-18 MED ORDER — CEFAZOLIN SODIUM-DEXTROSE 2-4 GM/100ML-% IV SOLN
INTRAVENOUS | Status: AC
  Filled 2023-07-18: qty 100

## 2023-07-18 MED ORDER — HYDROCODONE-ACETAMINOPHEN 5-325 MG PO TABS: 1.0000 | ORAL_TABLET | Freq: Four times a day (QID) | ORAL | 0 refills | Status: AC | PRN

## 2023-07-18 MED ORDER — OXYCODONE HCL 5 MG PO TABS
ORAL_TABLET | ORAL | Status: AC
  Filled 2023-07-18: qty 1

## 2023-07-18 MED ORDER — METOPROLOL TARTRATE 5 MG/5ML IV SOLN
INTRAVENOUS | Status: DC | PRN
Start: 2023-07-18 — End: 2023-07-18
  Administered 2023-07-18: 2 mg via INTRAVENOUS
  Administered 2023-07-18: 1 mg via INTRAVENOUS

## 2023-07-18 MED ORDER — ONDANSETRON HCL 4 MG/2ML IJ SOLN
INTRAMUSCULAR | Status: DC | PRN
  Administered 2023-07-18: 4 mg via INTRAVENOUS

## 2023-07-18 MED ORDER — LIDOCAINE HCL URETHRAL/MUCOSAL 2 % EX GEL
CUTANEOUS | Status: DC | PRN
Start: 2023-07-18 — End: 2023-07-18
  Administered 2023-07-18: 1 via TOPICAL

## 2023-07-18 MED ORDER — ACETAMINOPHEN 10 MG/ML IV SOLN
INTRAVENOUS | Status: AC
  Filled 2023-07-18: qty 100

## 2023-07-18 MED ORDER — OXYCODONE HCL 5 MG PO TABS
5.0000 mg | ORAL_TABLET | Freq: Once | ORAL | Status: AC | PRN
  Administered 2023-07-18: 5 mg via ORAL

## 2023-07-18 MED ORDER — LACTATED RINGERS IV SOLN: INTRAVENOUS | Status: DC

## 2023-07-18 MED ORDER — ACETAMINOPHEN 10 MG/ML IV SOLN: 1000.0000 mg | Freq: Once | INTRAVENOUS | Status: DC | PRN

## 2023-07-18 MED ORDER — LIDOCAINE HCL 1 % IJ SOLN
10.0000 mL | Freq: Once | INTRAMUSCULAR | Status: AC
  Administered 2023-07-18: 10 mL
  Filled 2023-07-18: qty 10

## 2023-07-18 MED ORDER — HEMOSTATIC AGENTS (NO CHARGE) OPTIME
TOPICAL | Status: DC | PRN
  Administered 2023-07-18: 1 via TOPICAL

## 2023-07-18 MED ORDER — KETAMINE HCL 50 MG/5ML IJ SOSY
PREFILLED_SYRINGE | INTRAMUSCULAR | Status: AC
  Filled 2023-07-18: qty 5

## 2023-07-18 MED ORDER — BUPIVACAINE-EPINEPHRINE (PF) 0.25% -1:200000 IJ SOLN
INTRAMUSCULAR | Status: AC
  Filled 2023-07-18: qty 30

## 2023-07-18 MED ORDER — ACETAMINOPHEN 10 MG/ML IV SOLN
INTRAVENOUS | Status: DC | PRN
  Administered 2023-07-18: 1000 mg via INTRAVENOUS

## 2023-07-18 MED ORDER — ONDANSETRON HCL 4 MG/2ML IJ SOLN: 4.0000 mg | Freq: Once | INTRAMUSCULAR | Status: DC | PRN

## 2023-07-18 MED ORDER — CHLORHEXIDINE GLUCONATE 0.12 % MT SOLN
OROMUCOSAL | Status: AC
  Filled 2023-07-18: qty 15

## 2023-07-18 MED ORDER — MIDAZOLAM HCL 2 MG/2ML IJ SOLN
INTRAMUSCULAR | Status: AC
  Filled 2023-07-18: qty 2

## 2023-07-18 MED ORDER — DEXMEDETOMIDINE HCL IN NACL 80 MCG/20ML IV SOLN
INTRAVENOUS | Status: DC | PRN
  Administered 2023-07-18: 8 ug via INTRAVENOUS
  Administered 2023-07-18: 4 ug via INTRAVENOUS
  Administered 2023-07-18: 8 ug via INTRAVENOUS

## 2023-07-18 MED ORDER — CHLORHEXIDINE GLUCONATE 0.12 % MT SOLN: 15.0000 mL | Freq: Once | OROMUCOSAL | Status: AC

## 2023-07-18 MED ORDER — OXIDIZED CELLULOSE EX PADS: MEDICATED_PAD | CUTANEOUS | Status: DC | PRN

## 2023-07-18 MED ORDER — PROPOFOL 500 MG/50ML IV EMUL
INTRAVENOUS | Status: DC | PRN
  Administered 2023-07-18: 150 ug/kg/min via INTRAVENOUS

## 2023-07-18 MED ORDER — BUPIVACAINE-EPINEPHRINE 0.25% -1:200000 IJ SOLN
INTRAMUSCULAR | Status: DC | PRN
  Administered 2023-07-18: 10 mL

## 2023-07-18 MED ORDER — 0.9 % SODIUM CHLORIDE (POUR BTL) OPTIME
TOPICAL | Status: DC | PRN
  Administered 2023-07-18: 500 mL

## 2023-07-18 MED ORDER — OXYCODONE HCL 5 MG/5ML PO SOLN: 5.0000 mg | Freq: Once | ORAL | Status: AC | PRN

## 2023-07-18 MED ORDER — MIDAZOLAM HCL 2 MG/2ML IJ SOLN
INTRAMUSCULAR | Status: DC | PRN
  Administered 2023-07-18: 2 mg via INTRAVENOUS

## 2023-07-18 MED ORDER — ORAL CARE MOUTH RINSE
15.0000 mL | Freq: Once | OROMUCOSAL | Status: AC
  Administered 2023-07-18: 15 mL via OROMUCOSAL

## 2023-07-18 MED ORDER — KETAMINE HCL 50 MG/5ML IJ SOSY
PREFILLED_SYRINGE | INTRAMUSCULAR | Status: DC | PRN
Start: 2023-07-18 — End: 2023-07-18
  Administered 2023-07-18: 10 mg via INTRAVENOUS
  Administered 2023-07-18 (×2): 20 mg via INTRAVENOUS

## 2023-07-18 MED ORDER — CEFAZOLIN SODIUM-DEXTROSE 2-4 GM/100ML-% IV SOLN
2.0000 g | INTRAVENOUS | Status: AC
  Administered 2023-07-18: 2 g via INTRAVENOUS

## 2023-07-18 SURGICAL SUPPLY — 23 items
BLADE CLIPPER SURG (BLADE) ×1 IMPLANT
BLADE SURG SZ11 CARB STEEL (BLADE) ×1 IMPLANT
CHLORAPREP W/TINT 26 (MISCELLANEOUS) IMPLANT
COVER PROBE FLX POLY STRL (MISCELLANEOUS) IMPLANT
DERMABOND ADVANCED .7 DNX12 (GAUZE/BANDAGES/DRESSINGS) IMPLANT
DRAPE LAPAROTOMY 77X122 PED (DRAPES) ×1 IMPLANT
ELECTRODE REM PT RTRN 9FT ADLT (ELECTROSURGICAL) ×1 IMPLANT
GAUZE PACKING IODOFORM 1/2INX (GAUZE/BANDAGES/DRESSINGS) IMPLANT
GAUZE SPONGE 4X4 12PLY STRL (GAUZE/BANDAGES/DRESSINGS) IMPLANT
GLOVE BIO SURGEON STRL SZ 6.5 (GLOVE) ×1 IMPLANT
GLOVE BIOGEL PI IND STRL 6.5 (GLOVE) ×1 IMPLANT
GOWN STRL REUS W/ TWL LRG LVL3 (GOWN DISPOSABLE) ×2 IMPLANT
KIT TURNOVER KIT A (KITS) ×1 IMPLANT
MANIFOLD NEPTUNE II (INSTRUMENTS) ×1 IMPLANT
NDL HYPO 22X1.5 SAFETY MO (MISCELLANEOUS) ×1 IMPLANT
NEEDLE HYPO 22X1.5 SAFETY MO (MISCELLANEOUS) ×1 IMPLANT
NS IRRIG 1000ML POUR BTL (IV SOLUTION) ×1 IMPLANT
PACK BASIN MINOR ARMC (MISCELLANEOUS) ×1 IMPLANT
POWDER SURGICEL 3.0 GRAM (HEMOSTASIS) IMPLANT
SOLUTION PREP PVP 2OZ (MISCELLANEOUS) IMPLANT
SPONGE T-LAP 18X18 ~~LOC~~+RFID (SPONGE) ×1 IMPLANT
TRAP FLUID SMOKE EVACUATOR (MISCELLANEOUS) ×1 IMPLANT
WATER STERILE IRR 500ML POUR (IV SOLUTION) ×1 IMPLANT

## 2023-07-18 NOTE — Op Note (Signed)
 Preoperative diagnosis: Left chest puncture wound with foreign body                                           Hematoma of breast.  Postoperative diagnosis: Same.   Procedure: Left radiofrequency tag-localized and ultrasound guided excision of foreign body  Mastotomy with evacuation of hematoma                     Anesthesia: GETA  Surgeon: Dr. Dortha Gauss  Wound Classification: Clean  Indications: Patient is a 63 y.o. female with a foreign body from puncture wound. Also with non resolving chronic hematomas.   Findings: 1. Foreign body 30g needle completely removed 2. Savi scout radiofrequency completely removed.  3. Two chronic hematomas with cavity removed and sent to pathology  Description of procedure: Preoperative radiofrequency tag localization was performed by radiology. The patient was taken to the operating room and placed supine on the operating table, and after sedation the left chest was prepped and draped in the usual sterile fashion. A time-out was completed verifying correct patient, procedure, site, positioning, and implant(s) and/or special equipment prior to beginning this procedure.  I was unable to get signal from [facility].  By comparing the localization studies and use of ultrasound, the probable trajectory and location of the foreign body was planned. A circumareolar skin incision was planned in such a way as to minimize the amount of dissection to reach the foreign body and hematomas.  The skin incision was made. Flaps were raised. Dissection was then taken down until the hematomas were identified.  Once the hematomas were drained I was able to localize the foreign body (30-gauge needle) and the [facility] scout.  Both were not obvious where removed and sent to pathology for confirmation.  Then I went to dissect around the hematoma cavity.  Both hematomas with each cavity were removed.  The breast cavity was irrigated with saline.  Hemostasis was checked.  Surgicel powder  was applied.  The wound was closed with interrupted sutures of 3-0 Vicryl and a subcuticular suture of Monocryl 4-0. No attempt was made to close the dead space.   Specimen: Left breast hematomas                    30-gauge needle and Savi scout  Complications: None  Estimated Blood Loss: 5 mL

## 2023-07-18 NOTE — Transfer of Care (Signed)
 Immediate Anesthesia Transfer of Care Note  Patient: Alicia Fischer  Procedure(s) Performed: EXPLORATION OF PENETRATING WOUND (Left: Breast)  Patient Location: PACU  Anesthesia Type:General  Level of Consciousness: awake  Airway & Oxygen Therapy: Patient Spontanous Breathing  Post-op Assessment: Report given to RN and Post -op Vital signs reviewed and stable  Post vital signs: stable  Last Vitals:  Vitals Value Taken Time  BP 113/58 07/18/23 1351  Temp    Pulse 87 07/18/23 1353  Resp 15 07/18/23 1353  SpO2 92 % 07/18/23 1353  Vitals shown include unfiled device data.  Last Pain:  Vitals:   07/18/23 1133  TempSrc: Tympanic         Complications: No notable events documented.

## 2023-07-18 NOTE — Anesthesia Preprocedure Evaluation (Signed)
 Anesthesia Evaluation  Patient identified by MRN, date of birth, ID band Patient awake    Reviewed: Allergy & Precautions, NPO status , Patient's Chart, lab work & pertinent test results  History of Anesthesia Complications Negative for: history of anesthetic complications  Airway Mallampati: II  TM Distance: >3 FB Neck ROM: Full    Dental  (+) Edentulous Upper, Edentulous Lower   Pulmonary sleep apnea and Continuous Positive Airway Pressure Ventilation , neg COPD, Patient abstained from smoking.Not current smoker, former smoker   Pulmonary exam normal breath sounds clear to auscultation       Cardiovascular Exercise Tolerance: Good METShypertension, Pt. on medications + CAD and +CHF  (-) Past MI (-) dysrhythmias  Rhythm:Regular Rate:Normal - Systolic murmurs    Neuro/Psych Right leg weakness CVA, Residual Symptoms  negative psych ROS   GI/Hepatic ,GERD  Medicated and Controlled,,(+)     (-) substance abuse    Endo/Other  diabetes, Well Controlled    Renal/GU negative Renal ROS     Musculoskeletal   Abdominal  (+) + obese  Peds  Hematology   Anesthesia Other Findings Past Medical History: 6/10: Chest pain     Comment:  probable costochondritits No date: CHF (congestive heart failure) (HCC) No date: Coronary artery disease No date: Diabetes mellitus No date: Dyslipidemia No date: Dyspnea     Comment:  Chronic, Echo 7/10: EF 55-60%, no valvular problems No date: Enlarged heart No date: GERD (gastroesophageal reflux disease) No date: History of stroke No date: Hyperlipidemia     Comment:  on Lipitor  No date: Hypertension     Comment:  on Lisinopril/ HCTZ No date: Obesity No date: OSA (obstructive sleep apnea) 2002: Stroke (HCC)     Comment:  without any residual deficit No date: Type 2 diabetes mellitus (HCC)     Comment:  on metformin  Reproductive/Obstetrics                              Anesthesia Physical Anesthesia Plan  ASA: 3  Anesthesia Plan: General   Post-op Pain Management: Minimal or no pain anticipated and Ofirmev  IV (intra-op)*   Induction: Intravenous  PONV Risk Score and Plan: 3 and Propofol infusion, TIVA, Ondansetron , Dexamethasone, Midazolam  and Treatment may vary due to age or medical condition  Airway Management Planned: Nasal Cannula  Additional Equipment: None  Intra-op Plan:   Post-operative Plan:   Informed Consent: I have reviewed the patients History and Physical, chart, labs and discussed the procedure including the risks, benefits and alternatives for the proposed anesthesia with the patient or authorized representative who has indicated his/her understanding and acceptance.     Dental advisory given  Plan Discussed with: CRNA and Surgeon  Anesthesia Plan Comments: (Discussed risks of anesthesia with patient, including possibility of difficulty with spontaneous ventilation under anesthesia necessitating airway intervention, PONV, and rare risks such as cardiac or respiratory or neurological events, and allergic reactions. Discussed the role of CRNA in patient's perioperative care. Patient understands.)       Anesthesia Quick Evaluation

## 2023-07-18 NOTE — H&P (Signed)
 History of Present Illness Alicia Fischer is a 63 y.o. female with foreign body in the left breast.  Patient who presented yesterday for aspiration of hematoma.  This was complicated by rupture of the needle with leaving the needle inside the breast.  Patient with pain in the left breast.  No pain radiation.  No alleviating or aggravating factor.  No sign of active bleeding.  Past Medical History Past Medical History:  Diagnosis Date   Chest pain 6/10   probable costochondritits   CHF (congestive heart failure) (HCC)    Coronary artery disease    Diabetes mellitus    Dyslipidemia    Dyspnea    Chronic, Echo 7/10: EF 55-60%, no valvular problems   Enlarged heart    GERD (gastroesophageal reflux disease)    History of stroke    Hyperlipidemia    on Lipitor    Hypertension    on Lisinopril/ HCTZ   Obesity    OSA (obstructive sleep apnea)    Stroke (HCC) 2002   without any residual deficit   Type 2 diabetes mellitus (HCC)    on metformin       Past Surgical History:  Procedure Laterality Date   BREAST BIOPSY Left 2014   neg   BREAST BIOPSY Left 04/11/2023   Stereo bx, ribbon clip,FIBROADENOMATOID CHANGE WITH ASSOCIATED COARSE CALCIFICATIONS - FIBROCYSTIC CHANGES INCLUDING APOCRINE METAPLASIA - ADENOSIS - NEGATIVE FOR ATYPIA OR MALIGNANCY.   BREAST BIOPSY Left 04/11/2023   MM LT BREAST BX W LOC DEV 1ST LESION IMAGE BX SPEC STEREO GUIDE 04/11/2023 [facility]   CARDIAC CATHETERIZATION  2010   CAROTID PTA/STENT INTERVENTION Right 05/25/2019   Procedure: CAROTID PTA/STENT INTERVENTION;  Surgeon: Celso College, MD;  Location: [facility] IN[facility]AS[facility];  Service: Cardiovascular;  Laterality: Right;   CERVICAL ABLATION      Allergies  Allergen Reactions   Atenolol Other (See Comments)   Beta Adrenergic Blockers    Metformin Other (See Comments)   Sulfa Antibiotics Nausea And Vomiting   Codeine Rash and Other (See Comments)   Contrast Media [Iodinated Contrast Media] Rash     Other reaction(s): Unknown    Current Facility-Administered Medications  Medication Dose Route Frequency Provider Last Rate Last Admin   ceFAZolin  (ANCEF ) IVPB 2g/100 mL premix  2 g Intravenous On Call to OR Eldred Grego, MD       chlorhexidine  (PERIDEX ) 0.12 % solution 15 mL  15 mL Mouth/Throat Once Nancey Awkward, MD       Or   Oral care mouth rinse  15 mL Mouth Rinse Once Oliver, Tesia L, MD       lactated ringers infusion   Intravenous Continuous Nancey Awkward, MD        Family History Family History  Problem Relation Age of Onset   Heart attack Father 75   Breast cancer Mother 58   Heart attack Mother        Social History Social History   Tobacco Use   Smoking status: Former    Current packs/day: 0.00    Types: Cigarettes    Quit date: 2001    Years since quitting: 24.3   Smokeless tobacco: Never  Vaping Use   Vaping status: Never Used  Substance Use Topics   Alcohol use: No   Drug use: Never        ROS Full ROS of systems performed and is otherwise negative there than what is stated in the HPI  Physical Exam Blood  pressure (!) 149/75, pulse 96, temperature 98.3 F (36.8 C), temperature source Tympanic, resp. rate 16, height 5' 8.5" (1.74 m), weight 117.9 kg, SpO2 100%.  CONSTITUTIONAL: Alert, oriented x 3 EYES: Pupils equal, round, and reactive to light, Sclera non-icteric. EARS, NOSE, MOUTH AND THROAT: The oropharynx is clear. Oral mucosa is pink and moist. Hearing is intact to voice.  NECK: Trachea is midline, and there is no jugular venous distension. Thyroid is without palpable abnormalities. LYMPH NODES:  Lymph nodes in the neck are not enlarged. RESPIRATORY:  Lungs are clear, and breath sounds are equal bilaterally. Normal respiratory effort without pathologic use of accessory muscles. CARDIOVASCULAR: Heart is regular without murmurs, gallops, or rubs. GI: The abdomen is soft, nontender, and nondistended. There were no palpable masses.  There was no hepatosplenomegaly. There were normal bowel sounds. GU: Deferred MUSCULOSKELETAL:  Normal muscle strength and tone in all four extremities.    SKIN: Skin turgor is normal. There are no pathologic skin lesions.  NEUROLOGIC:  Motor and sensation is grossly normal.  Cranial nerves are grossly intact. PSYCH:  Alert and oriented to person, place and time. Affect is normal.  Data Reviewed Diagnostic mammogram today showing Scout next to the retained needle  I have personally reviewed the patient's imaging and medical records.    Assessment    Puncture wound of the left breast with foreign object  Plan    Removal of foreign object of the left breast   Eldred Grego, MD  Eldred Grego 07/18/2023, 12:18 PM

## 2023-07-18 NOTE — Anesthesia Postprocedure Evaluation (Signed)
 Anesthesia Post Note  Patient: Alicia Fischer  Procedure(s) Performed: EXPLORATION OF PENETRATING WOUND (Left: Breast)  Patient location during evaluation: PACU Anesthesia Type: General Level of consciousness: awake and alert Pain management: pain level controlled Vital Signs Assessment: post-procedure vital signs reviewed and stable Respiratory status: spontaneous breathing, nonlabored ventilation, respiratory function stable and patient connected to nasal cannula oxygen Cardiovascular status: blood pressure returned to baseline and stable Postop Assessment: no apparent nausea or vomiting Anesthetic complications: no   There were no known notable events for this encounter.   Last Vitals:  Vitals:   07/18/23 1351 07/18/23 1400  BP: (!) 113/58 93/76  Pulse: 91 84  Resp: (!) 21 (!) 25  Temp: (!) 36.2 C   SpO2: 91% 95%    Last Pain:  Vitals:   07/18/23 1400  TempSrc:   PainSc: 0-No pain                 Lattie Poli

## 2023-07-18 NOTE — Discharge Instructions (Signed)
   Diet: Resume home heart healthy regular diet.   Activity: Increase activity as tolerated. Light activity and walking are encouraged. Do not drive or drink alcohol if taking narcotic pain medications.  Wound care: May shower with soapy water and pat dry (do not rub incisions), but no baths or submerging incision underwater until follow-up. (no swimming)   Medications: Resume all home medications. For mild to moderate pain: acetaminophen (Tylenol) or ibuprofen (if no kidney disease). Combining Tylenol with alcohol can substantially increase your risk of causing liver disease. Narcotic pain medications, if prescribed, can be used for severe pain, though may cause nausea, constipation, and drowsiness. Do not combine Tylenol and Norco within a 6 hour period as Norco contains Tylenol. If you do not need the narcotic pain medication, you do not need to fill the prescription.  Call office [phone]) at any time if any questions, worsening pain, fevers/chills, bleeding, drainage from incision site, or other concerns.

## 2023-07-19 ENCOUNTER — Encounter: Payer: Self-pay | Admitting: General Surgery

## 2023-07-22 LAB — SURGICAL PATHOLOGY

## 2023-07-29 ENCOUNTER — Encounter: Payer: Self-pay | Admitting: General Surgery

## 2023-08-19 NOTE — Addendum Note (Signed)
 Encounter addended by: Hilliard Loyal, RT on: 08/19/2023 1:12 PM  Actions taken: Imaging Exam ended

## 2023-08-27 NOTE — Progress Notes (Signed)
   History of Present Illness Alicia Fischer is a 63 year old female who presents for re-evaluation of a left breast seroma after excision of a foreign body.  The seroma has not completely resolved but is significantly smaller than before. Initially, there was a large cavity, which has now reduced to a tiny cavity. She experiences muscle spasms and occasional sharp pains in the area.  She has been keeping the area covered with a patch, which she applied this morning.   PAST SURGICAL HISTORY:   Past Surgical History:  Procedure Laterality Date  . REMOVAL FOREIGN BODY CHEST Left 07/18/2023   Dr Lucas Catchings -- lt breast (needle)         PHYSICAL EXAM:  Vitals:   08/27/23 0932  BP: (!) 155/71  Pulse: 74  .  Ht:172.7 cm (5' 8) Wt:(!) 119.7 kg (264 lb) ADJ:Anib surface area is 2.4 meters squared. Body mass index is 40.14 kg/m.SABRA   GENERAL: Alert, active, oriented x3  BREAST: Left breast small open wound with very small cavity.  No drainage.  No erythema.   Assessment & Plan Seroma of left breast post-surgery   The seroma in the left breast is healing well with a significant reduction in cavity size. Muscle spasm and sharp pain are present, likely due to muscle involvement or scar tissue. Complete closure is expected in a couple of weeks. Keep the area covered with paper tape. Schedule follow-up appointment in two weeks.  Puncture wound of left front wall of thorax with foreign body without penetration into thoracic cavity, initial encounter [S21.142A]         Patient verbalized understanding, all questions were answered, and were agreeable with the plan outlined above.   Lucas Sjogren, MD

## 2023-09-13 LAB — COLOGUARD

## 2024-02-14 ENCOUNTER — Ambulatory Visit (INDEPENDENT_AMBULATORY_CARE_PROVIDER_SITE_OTHER): Payer: Medicare HMO | Admitting: Vascular Surgery

## 2024-02-14 ENCOUNTER — Encounter (INDEPENDENT_AMBULATORY_CARE_PROVIDER_SITE_OTHER): Payer: Medicare HMO

## 2024-03-03 ENCOUNTER — Other Ambulatory Visit (INDEPENDENT_AMBULATORY_CARE_PROVIDER_SITE_OTHER): Payer: Self-pay | Admitting: Vascular Surgery

## 2024-03-03 DIAGNOSIS — I6523 Occlusion and stenosis of bilateral carotid arteries: Secondary | ICD-10-CM

## 2024-03-13 ENCOUNTER — Encounter (INDEPENDENT_AMBULATORY_CARE_PROVIDER_SITE_OTHER): Payer: Self-pay | Admitting: Vascular Surgery

## 2024-03-13 ENCOUNTER — Ambulatory Visit (INDEPENDENT_AMBULATORY_CARE_PROVIDER_SITE_OTHER): Admitting: Vascular Surgery

## 2024-03-13 ENCOUNTER — Ambulatory Visit (INDEPENDENT_AMBULATORY_CARE_PROVIDER_SITE_OTHER)

## 2024-03-13 VITALS — BP 120/74 | HR 85 | Resp 18 | Wt 248.4 lb

## 2024-03-13 DIAGNOSIS — E785 Hyperlipidemia, unspecified: Secondary | ICD-10-CM

## 2024-03-13 DIAGNOSIS — I6523 Occlusion and stenosis of bilateral carotid arteries: Secondary | ICD-10-CM

## 2024-03-13 DIAGNOSIS — E1151 Type 2 diabetes mellitus with diabetic peripheral angiopathy without gangrene: Secondary | ICD-10-CM

## 2024-03-13 DIAGNOSIS — I10 Essential (primary) hypertension: Secondary | ICD-10-CM

## 2024-03-13 NOTE — Progress Notes (Signed)
 "   MRN : [id number]  Alicia Fischer is a 64 y.o. ([birth year]) female who presents with chief complaint of  Chief Complaint  Patient presents with   Follow-up    46yr carotid follow up  .  History of Present Illness:   Discussed the use of AI scribe software for clinical note transcription with the patient, who gave verbal consent to proceed.  History of Present Illness Alicia Fischer is a 64 year old female with bilateral carotid artery stenosis, status post right carotid stent placement, who presents for routine vascular surgery follow-up.  She underwent right carotid artery stent placement about four years ago for high-grade stenosis, though she is unsure of the exact date. She had had a stroke prior to her procedure, but no residual symptoms  She is asymptomatic with no new neurologic symptoms or interval changes since her last follow-up.    Results Diagnostic Carotid artery duplex ultrasound (03/13/2024): Right carotid stent patent without restenosis; left carotid artery chronically occluded, stable compared to prior studies.  Current Outpatient Medications  Medication Sig Dispense Refill   acetaminophen  (TYLENOL ) 325 MG tablet Take 650 mg by mouth daily.      Albuterol Sulfate (PROAIR RESPICLICK) 108 (90 Base) MCG/ACT AEPB Inhale into the lungs.     aspirin  81 MG tablet Take 81 mg by mouth daily.     atorvastatin  (LIPITOR ) 80 MG tablet Take 80 mg by mouth daily.     clopidogrel  (PLAVIX ) 75 MG tablet TAKE 1 TABLET BY MOUTH EVERY DAY 90 tablet 3   cyclobenzaprine  (FLEXERIL ) 5 MG tablet Take 1 tablet (5 mg total) by mouth 3 (three) times daily as needed. 15 tablet 0   famotidine  (PEPCID ) 20 MG tablet Take 20 mg by mouth 2 (two) times daily.      fexofenadine  (ALLEGRA  ALLERGY) 180 MG tablet Take 1 tablet (180 mg total) by mouth daily. 30 tablet 0   fluticasone  (FLONASE ) 50 MCG/ACT nasal spray Place 2 sprays into both nostrils daily. 16 mL 0   gabapentin  (NEURONTIN ) 300 MG  capsule Take 300 mg by mouth See admin instructions. Take 1 capsule (300mg ) by mouth every the morning and take 2 capsules (600mg ) every night     losartan  (COZAAR ) 100 MG tablet Take 100 mg by mouth daily.     nitroGLYCERIN  (NITROSTAT ) 0.4 MG SL tablet Place 0.4 mg under the tongue every 5 (five) minutes as needed for chest pain.      VOLTAREN 1 % GEL Apply topically.     amLODipine  (NORVASC ) 5 MG tablet Take 5 mg by mouth daily. (Patient not taking: Reported on 03/13/2024)     cetirizine (ZYRTEC) 10 MG tablet Take 10 mg by mouth daily. (Patient not taking: Reported on 03/13/2024)     colchicine  0.6 MG tablet Take 1 tablet (0.6 mg total) by mouth daily. Then take 2 tablets then 1 hour later take an additional tablet. Thereafter, take 1 tablet daily for 1 week. (Patient not taking: Reported on 03/13/2024) 9 tablet 0   phentermine (ADIPEX-P) 37.5 MG tablet Take by mouth every morning. (Patient not taking: Reported on 03/13/2024)     No current facility-administered medications for this visit.    Past Medical History:  Diagnosis Date   Chest pain 6/10   probable costochondritits   CHF (congestive heart failure) (HCC)    Coronary artery disease    Diabetes mellitus    Dyslipidemia    Dyspnea    Chronic, Echo 7/10: EF 55-60%, no  valvular problems   Enlarged heart    GERD (gastroesophageal reflux disease)    History of stroke    Hyperlipidemia    on Lipitor    Hypertension    on Lisinopril/ HCTZ   Obesity    OSA (obstructive sleep apnea)    Stroke (HCC) 2002   without any residual deficit   Type 2 diabetes mellitus (HCC)    on metformin    Past Surgical History:  Procedure Laterality Date   BREAST BIOPSY Left 2014   neg   BREAST BIOPSY Left 04/11/2023   Stereo bx, ribbon clip,FIBROADENOMATOID CHANGE WITH ASSOCIATED COARSE CALCIFICATIONS - FIBROCYSTIC CHANGES INCLUDING APOCRINE METAPLASIA - ADENOSIS - NEGATIVE FOR ATYPIA OR MALIGNANCY.   BREAST BIOPSY Left 04/11/2023   MM LT BREAST BX W  LOC DEV 1ST LESION IMAGE BX SPEC STEREO GUIDE 04/11/2023 [facility]   BREAST BIOPSY  07/18/2023   MM LT BREAST SAVI/RF TAG 1ST LESION MAMMO GUIDE 07/18/2023 [facility]   CARDIAC CATHETERIZATION  2010   CAROTID PTA/STENT INTERVENTION Right 05/25/2019   Procedure: CAROTID PTA/STENT INTERVENTION;  Surgeon: Marea Selinda RAMAN, MD;  Location: [facility];  Service: Cardiovascular;  Laterality: Right;   CERVICAL ABLATION     EXPLORATION OF PENETRATING WOUND Left 07/18/2023   Procedure: EXPLORATION OF PENETRATING WOUND;  Surgeon: Rodolph Romano, MD;  Location: [facility];  Service: General;  Laterality: Left;     Social History[1]     Family History  Problem Relation Age of Onset   Heart attack Father 57   Breast cancer Mother 3   Heart attack Mother      Allergies[2]   REVIEW OF SYSTEMS (Negative unless checked)   Constitutional: [] Weight loss  [] Fever  [] Chills Cardiac: [x] Chest pain   [] Chest pressure   [] Palpitations   [] Shortness of breath when laying flat   [] Shortness of breath at rest   [x] Shortness of breath with exertion. Vascular:  [] Pain in legs with walking   [] Pain in legs at rest   [] Pain in legs when laying flat   [] Claudication   [] Pain in feet when walking  [] Pain in feet at rest  [] Pain in feet when laying flat   [] History of DVT   [] Phlebitis   [] Swelling in legs   [] Varicose veins   [] Non-healing ulcers Pulmonary:   [] Uses home oxygen   [] Productive cough   [] Hemoptysis   [] Wheeze  [] COPD   [] Asthma Neurologic:  [] Dizziness  [] Blackouts   [] Seizures   [x] History of stroke   [] History of TIA  [] Aphasia   [] Temporary blindness   [] Dysphagia   [] Weakness or numbness in arms   [] Weakness or numbness in legs Musculoskeletal:  [x] Arthritis   [] Joint swelling   [x] Joint pain   [] Low back pain Hematologic:  [] Easy bruising  [] Easy bleeding   [] Hypercoagulable state   [] Anemic  [] Hepatitis Gastrointestinal:  [] Blood in stool   [] Vomiting blood   [x] Gastroesophageal reflux/heartburn   [] Abdominal pain Genitourinary:  [] Chronic kidney disease   [] Difficult urination  [] Frequent urination  [] Burning with urination   [] Hematuria Skin:  [] Rashes   [] Ulcers   [] Wounds Psychological:  [] History of anxiety   []  History of major depression.  Physical Examination  Vitals:   03/13/24 1048  BP: 120/74  Pulse: 85  Resp: 18  Weight: 248 lb 6.4 oz (112.7 kg)   Body mass index is 37.22 kg/m. Gen:  WD/WN, NAD Head: [place]/AT, No temporalis wasting. Ear/Nose/Throat: Hearing grossly intact, nares w/o erythema or drainage, trachea midline Eyes:  Conjunctiva clear. Sclera non-icteric Neck: Supple.  No bruit  Pulmonary:  Good air movement, equal and clear to auscultation bilaterally.  Cardiac: RRR, No JVD Vascular:  Vessel Right Left  Radial Palpable Palpable        Neurologic: CN 2-12 intact. Sensation grossly intact in extremities.  Symmetrical.  Speech is fluent. Motor exam as listed above. Psychiatric: Judgment intact, Mood & affect appropriate for pt's clinical situation. Dermatologic: No rashes or ulcers noted.  No cellulitis or open wounds.   Physical Exam    CBC Lab Results  Component Value Date   WBC 7.3 03/25/2023   HGB 13.0 03/25/2023   HCT 41.0 03/25/2023   MCV 85.8 03/25/2023   PLT 325 03/25/2023    BMET    Component Value Date/Time   NA 133 (L) 03/25/2023 1317   NA 139 09/04/2013 1115   K 5.2 (H) 03/25/2023 1317   K 3.8 09/04/2013 1115   CL 100 03/25/2023 1317   CL 105 09/04/2013 1115   CO2 23 03/25/2023 1317   CO2 29 09/04/2013 1115   GLUCOSE 93 03/25/2023 1317   GLUCOSE 85 09/04/2013 1115   BUN 22 03/25/2023 1317   BUN 13 09/04/2013 1115   CREATININE 1.24 (H) 03/25/2023 1317   CREATININE 1.16 09/04/2013 1115   CALCIUM  9.3 03/25/2023 1317   CALCIUM  9.6 09/04/2013 1115   GFRNONAA 49 (L) 03/25/2023 1317   GFRNONAA 53 (L) 09/04/2013 1115   GFRAA 33 (L) 05/26/2019 1008   GFRAA >60 09/04/2013 1115    CrCl cannot be calculated (Patient's most recent lab result is older than the maximum 21 days allowed.).  COAG Lab Results  Component Value Date   INR 1.0 07/26/2011   INR 1.0 07/10/2011    Radiology No results found.   Assessment/Plan Assessment & Plan Bilateral carotid artery stenosis with right carotid stent and chronic left carotid occlusion Asymptomatic with low risk of restenosis. - Reviewed carotid ultrasound showing patent right stent and stable left occlusion. - Reassured low restenosis risk. - Scheduled annual follow-up with carotid imaging in one year.  HYPERTENSION, BENIGN blood pressure control important in reducing the progression of atherosclerotic disease. On appropriate oral medications.     Diabetes (HCC) blood glucose control important in reducing the progression of atherosclerotic disease. Also, involved in wound healing. On appropriate medications.     Hyperlipidemia lipid control important in reducing the progression of atherosclerotic disease. Continue statin therapy     Selinda Gu, MD  03/13/2024 11:32 AM    This note was created with Dragon medical transcription system.  Any errors from dictation are purely unintentional     [1]  Social History Tobacco Use   Smoking status: Former    Current packs/day: 0.00    Types: Cigarettes    Quit date: 2001    Years since quitting: 25.0   Smokeless tobacco: Never  Vaping Use   Vaping status: Never Used  Substance Use Topics   Alcohol use: No   Drug use: Never  [2]  Allergies Allergen Reactions   Atenolol Other (See Comments)   Beta Adrenergic Blockers    Metformin Other (See Comments)   Sulfa Antibiotics Nausea And Vomiting   Codeine Rash and Other (See Comments)   Contrast Media [Iodinated Contrast Media] Rash    Other reaction(s): Unknown   "

## 2024-03-24 ENCOUNTER — Other Ambulatory Visit (INDEPENDENT_AMBULATORY_CARE_PROVIDER_SITE_OTHER): Payer: Self-pay | Admitting: Vascular Surgery

## 2025-03-16 ENCOUNTER — Encounter (INDEPENDENT_AMBULATORY_CARE_PROVIDER_SITE_OTHER)

## 2025-03-16 ENCOUNTER — Ambulatory Visit (INDEPENDENT_AMBULATORY_CARE_PROVIDER_SITE_OTHER): Admitting: Vascular Surgery
